# Patient Record
Sex: Female | Born: 1945
Health system: Southern US, Community
[De-identification: ages and names within clinical notes are randomized; demographics above are authoritative.]

## PROBLEM LIST (undated history)

## (undated) DIAGNOSIS — M1990 Unspecified osteoarthritis, unspecified site: Secondary | ICD-10-CM

## (undated) DIAGNOSIS — K5792 Diverticulitis of intestine, part unspecified, without perforation or abscess without bleeding: Secondary | ICD-10-CM

## (undated) DIAGNOSIS — E039 Hypothyroidism, unspecified: Secondary | ICD-10-CM

## (undated) DIAGNOSIS — C801 Malignant (primary) neoplasm, unspecified: Secondary | ICD-10-CM

## (undated) DIAGNOSIS — D649 Anemia, unspecified: Secondary | ICD-10-CM

## (undated) DIAGNOSIS — I10 Essential (primary) hypertension: Secondary | ICD-10-CM

## (undated) HISTORY — PX: EYE SURGERY: SHX253

## (undated) HISTORY — PX: BREAST BIOPSY: SHX20

## (undated) HISTORY — PX: COLONOSCOPY: SHX174

## (undated) HISTORY — PX: APPENDECTOMY: SHX54

---

## 2001-12-14 ENCOUNTER — Ambulatory Visit (HOSPITAL_BASED_OUTPATIENT_CLINIC_OR_DEPARTMENT_OTHER): Admission: RE | Admit: 2001-12-14 | Discharge: 2001-12-14 | Payer: Self-pay | Admitting: Orthopedic Surgery

## 2004-06-12 ENCOUNTER — Ambulatory Visit: Payer: Self-pay | Admitting: Internal Medicine

## 2005-08-23 ENCOUNTER — Ambulatory Visit: Payer: Self-pay

## 2006-02-11 ENCOUNTER — Emergency Department: Payer: Self-pay | Admitting: Emergency Medicine

## 2006-02-17 ENCOUNTER — Ambulatory Visit: Payer: Self-pay | Admitting: Pain Medicine

## 2006-02-28 ENCOUNTER — Ambulatory Visit: Payer: Self-pay | Admitting: Pain Medicine

## 2006-03-14 ENCOUNTER — Ambulatory Visit: Payer: Self-pay | Admitting: Pain Medicine

## 2006-04-03 ENCOUNTER — Ambulatory Visit: Payer: Self-pay | Admitting: Physician Assistant

## 2006-09-21 ENCOUNTER — Ambulatory Visit: Payer: Self-pay

## 2007-11-01 ENCOUNTER — Ambulatory Visit: Payer: Self-pay

## 2008-11-10 ENCOUNTER — Ambulatory Visit: Payer: Self-pay

## 2008-11-26 ENCOUNTER — Ambulatory Visit: Payer: Self-pay

## 2009-12-07 ENCOUNTER — Ambulatory Visit: Payer: Self-pay | Admitting: Unknown Physician Specialty

## 2010-09-13 DIAGNOSIS — G4700 Insomnia, unspecified: Secondary | ICD-10-CM | POA: Insufficient documentation

## 2010-11-23 ENCOUNTER — Ambulatory Visit: Payer: Self-pay | Admitting: Family Medicine

## 2010-12-09 ENCOUNTER — Ambulatory Visit: Payer: Self-pay | Admitting: Family Medicine

## 2010-12-22 ENCOUNTER — Other Ambulatory Visit: Payer: Self-pay | Admitting: Hematology and Oncology

## 2011-01-13 ENCOUNTER — Other Ambulatory Visit (HOSPITAL_COMMUNITY): Payer: Self-pay | Admitting: Hematology and Oncology

## 2011-11-24 ENCOUNTER — Emergency Department: Payer: Self-pay | Admitting: Emergency Medicine

## 2011-11-24 LAB — CBC
HCT: 35.1 % (ref 35.0–47.0)
HGB: 12 g/dL (ref 12.0–16.0)
MCH: 32 pg (ref 26.0–34.0)
MCHC: 34.3 g/dL (ref 32.0–36.0)
Platelet: 237 10*3/uL (ref 150–440)
WBC: 7.9 10*3/uL (ref 3.6–11.0)

## 2011-11-24 LAB — URINALYSIS, COMPLETE
Bacteria: NONE SEEN
Bilirubin,UR: NEGATIVE
Glucose,UR: NEGATIVE mg/dL (ref 0–75)
Leukocyte Esterase: NEGATIVE
Nitrite: NEGATIVE
Protein: NEGATIVE
RBC,UR: NONE SEEN /HPF (ref 0–5)
Specific Gravity: 1.003 (ref 1.003–1.030)
WBC UR: 1 /HPF (ref 0–5)

## 2011-11-24 LAB — COMPREHENSIVE METABOLIC PANEL
Albumin: 3.6 g/dL (ref 3.4–5.0)
BUN: 11 mg/dL (ref 7–18)
Bilirubin,Total: 0.3 mg/dL (ref 0.2–1.0)
Chloride: 106 mmol/L (ref 98–107)
Creatinine: 0.57 mg/dL — ABNORMAL LOW (ref 0.60–1.30)
EGFR (African American): >60
Glucose: 104 mg/dL — ABNORMAL HIGH (ref 65–99)
Osmolality: 279 (ref 275–301)
SGPT (ALT): 133 U/L — ABNORMAL HIGH (ref 12–78)
Total Protein: 7.5 g/dL (ref 6.4–8.2)

## 2011-12-01 ENCOUNTER — Ambulatory Visit: Payer: Self-pay | Admitting: Family Medicine

## 2011-12-13 ENCOUNTER — Ambulatory Visit: Payer: Self-pay | Admitting: Family Medicine

## 2012-12-14 ENCOUNTER — Ambulatory Visit: Payer: Self-pay | Admitting: Family Medicine

## 2013-05-31 DIAGNOSIS — R9431 Abnormal electrocardiogram [ECG] [EKG]: Secondary | ICD-10-CM | POA: Insufficient documentation

## 2013-11-26 DIAGNOSIS — E7800 Pure hypercholesterolemia, unspecified: Secondary | ICD-10-CM | POA: Insufficient documentation

## 2013-12-02 DIAGNOSIS — M25551 Pain in right hip: Secondary | ICD-10-CM | POA: Insufficient documentation

## 2013-12-02 DIAGNOSIS — M47816 Spondylosis without myelopathy or radiculopathy, lumbar region: Secondary | ICD-10-CM | POA: Insufficient documentation

## 2013-12-03 ENCOUNTER — Ambulatory Visit: Payer: Self-pay | Admitting: Family Medicine

## 2013-12-10 DIAGNOSIS — M5416 Radiculopathy, lumbar region: Secondary | ICD-10-CM | POA: Insufficient documentation

## 2013-12-10 DIAGNOSIS — M47818 Spondylosis without myelopathy or radiculopathy, sacral and sacrococcygeal region: Secondary | ICD-10-CM | POA: Insufficient documentation

## 2013-12-10 DIAGNOSIS — M461 Sacroiliitis, not elsewhere classified: Secondary | ICD-10-CM | POA: Insufficient documentation

## 2013-12-10 DIAGNOSIS — M48061 Spinal stenosis, lumbar region without neurogenic claudication: Secondary | ICD-10-CM | POA: Insufficient documentation

## 2013-12-31 DIAGNOSIS — M47816 Spondylosis without myelopathy or radiculopathy, lumbar region: Secondary | ICD-10-CM | POA: Insufficient documentation

## 2014-11-26 DIAGNOSIS — K5732 Diverticulitis of large intestine without perforation or abscess without bleeding: Secondary | ICD-10-CM | POA: Insufficient documentation

## 2014-12-10 ENCOUNTER — Other Ambulatory Visit: Payer: Self-pay | Admitting: Family Medicine

## 2014-12-10 DIAGNOSIS — Z1231 Encounter for screening mammogram for malignant neoplasm of breast: Secondary | ICD-10-CM

## 2014-12-11 ENCOUNTER — Ambulatory Visit
Admission: RE | Admit: 2014-12-11 | Discharge: 2014-12-11 | Disposition: A | Discharge status: Home / Self Care | Payer: Medicare PPO | Source: Ambulatory Visit | Attending: Family Medicine | Admitting: Family Medicine

## 2014-12-11 DIAGNOSIS — Z1231 Encounter for screening mammogram for malignant neoplasm of breast: Secondary | ICD-10-CM | POA: Diagnosis not present

## 2014-12-11 HISTORY — DX: Malignant (primary) neoplasm, unspecified: C80.1

## 2015-05-07 DIAGNOSIS — F418 Other specified anxiety disorders: Secondary | ICD-10-CM | POA: Insufficient documentation

## 2015-07-12 ENCOUNTER — Encounter: Payer: Self-pay | Admitting: Emergency Medicine

## 2015-07-12 ENCOUNTER — Ambulatory Visit
Admission: EM | Admit: 2015-07-12 | Discharge: 2015-07-12 | Disposition: A | Discharge status: Home / Self Care | Payer: Medicare Other | Attending: Family Medicine | Admitting: Family Medicine

## 2015-07-12 DIAGNOSIS — L259 Unspecified contact dermatitis, unspecified cause: Secondary | ICD-10-CM

## 2015-07-12 MED ORDER — PREDNISONE 20 MG PO TABS: ORAL_TABLET | ORAL | Status: DC

## 2015-07-12 MED ORDER — METHYLPREDNISOLONE SODIUM SUCC 125 MG IJ SOLR
62.5000 mg | Freq: Once | INTRAMUSCULAR | Status: AC
  Administered 2015-07-12: 62.5 mg via INTRAMUSCULAR

## 2015-07-12 NOTE — ED Notes (Signed)
Patient c/o itchy rash on both arms since last Sunday.  Patient states that on last Saturday she was working out in the yard.

## 2015-07-12 NOTE — ED Provider Notes (Signed)
CSN: [id number]     Arrival date & time 07/12/15  1426 History   First MD Initiated Contact with Patient 07/12/15 1435     Chief Complaint  Patient presents with  . Rash   (Consider location/radiation/quality/duration/timing/severity/associated sxs/prior Treatment) HPI: Patient presents today with symptoms of itchy rash on both arms. She states that her symptoms started last week. She did go to her primary care physician who gave her cream for triamcinolone. This is helped minimally. She has been taking Zyrtec daily. She does admit to being in her yard and does feel that perhaps she did get exposed to something in the yard. She has not been in the yard since that time. She denies any chest pain or shortness of breath. She denies any pain of the area or any colored discharge from the rash.  Past Medical History  Diagnosis Date  . Cancer ([place])     basal cell and melanoma   Past Surgical History  Procedure Laterality Date  . Breast biopsy Left 1980's &06/18/97    neg  . Appendectomy     Family History  Problem Relation Age of Onset  . Breast cancer Cousin    Social History  Substance Use Topics  . Smoking status: Never Smoker   . Smokeless tobacco: None  . Alcohol Use: Yes   OB History    No data available     Review of Systems: Negative except mentioned above.   Allergies  Erythromycin and Doxycycline  Home Medications   Prior to Admission medications   Medication Sig Start Date End Date Taking? Authorizing Provider  cetirizine (ZYRTEC) 10 MG tablet Take 10 mg by mouth daily.   Yes Historical Provider, MD  fluticasone (FLONASE) 50 MCG/ACT nasal spray Place 1 spray into both nostrils daily.   Yes Historical Provider, MD  levothyroxine (SYNTHROID, LEVOTHROID) 100 MCG tablet Take 100 mcg by mouth daily before breakfast.   Yes Historical Provider, MD   Meds Ordered and Administered this Visit  Medications - No data to display  BP 158/68 mmHg  Pulse 81  Temp(Src) 98.3 F  (36.8 C) (Tympanic)  Resp 16  Ht 5\' 2"  (1.575 m)  Wt 115 lb (52.164 kg)  BMI 21.03 kg/m2  SpO2 96% No data found.   Physical Exam   GENERAL: NAD HEENT: no pharyngeal erythema, no exudate, no cervical LAD RESP: CTA B CARD: RRR SKIN: Erythematous vesicular rash noted on the right upper arm and on the right forearm and left forearm, no signs of secondary infection noted NEURO: CN II-XII grossly intact   ED Course  Procedures (including critical care time)  Labs Review Labs Reviewed - No data to display  Imaging Review No results found.      MDM  A/P: Skin rash, contact dermatitis- we'll treat patient with Solumedrol IM in the office today, patient is continue her oral antihistamine daily, keep the area clean and dry, monitor for any signs of infection, will start oral prednisone tomorrow for a few days, if any further problems patient is to follow-up with her primary care physician as instructed. I've asked that she stay out of her yard until this area has cleared up.    Paulina Fusi, MD 07/12/15 [phone]

## 2015-08-26 DIAGNOSIS — S83231A Complex tear of medial meniscus, current injury, right knee, initial encounter: Secondary | ICD-10-CM | POA: Insufficient documentation

## 2015-11-09 ENCOUNTER — Other Ambulatory Visit: Payer: Self-pay | Admitting: Family Medicine

## 2015-11-09 DIAGNOSIS — Z1231 Encounter for screening mammogram for malignant neoplasm of breast: Secondary | ICD-10-CM

## 2015-12-14 ENCOUNTER — Ambulatory Visit
Admission: RE | Admit: 2015-12-14 | Discharge: 2015-12-14 | Disposition: A | Discharge status: Home / Self Care | Payer: Medicare Other | Source: Ambulatory Visit | Attending: Family Medicine | Admitting: Family Medicine

## 2015-12-14 DIAGNOSIS — Z1231 Encounter for screening mammogram for malignant neoplasm of breast: Secondary | ICD-10-CM | POA: Insufficient documentation

## 2016-01-27 ENCOUNTER — Other Ambulatory Visit: Payer: Self-pay | Admitting: Family Medicine

## 2016-01-27 DIAGNOSIS — Z780 Asymptomatic menopausal state: Secondary | ICD-10-CM

## 2016-03-07 ENCOUNTER — Ambulatory Visit
Admission: RE | Admit: 2016-03-07 | Discharge: 2016-03-07 | Disposition: A | Discharge status: Home / Self Care | Payer: Medicare Other | Source: Ambulatory Visit | Attending: Family Medicine | Admitting: Family Medicine

## 2016-03-07 DIAGNOSIS — Z780 Asymptomatic menopausal state: Secondary | ICD-10-CM | POA: Insufficient documentation

## 2016-03-07 DIAGNOSIS — M8580 Other specified disorders of bone density and structure, unspecified site: Secondary | ICD-10-CM | POA: Diagnosis not present

## 2016-09-14 ENCOUNTER — Other Ambulatory Visit: Payer: Self-pay | Admitting: Family Medicine

## 2016-09-15 ENCOUNTER — Other Ambulatory Visit: Payer: Self-pay | Admitting: Family Medicine

## 2016-09-15 DIAGNOSIS — N6459 Other signs and symptoms in breast: Secondary | ICD-10-CM

## 2016-10-25 ENCOUNTER — Other Ambulatory Visit: Payer: Medicare Other

## 2016-10-27 ENCOUNTER — Ambulatory Visit
Admission: RE | Admit: 2016-10-27 | Discharge: 2016-10-27 | Disposition: A | Discharge status: Home / Self Care | Payer: Medicare Other | Source: Ambulatory Visit | Attending: Family Medicine | Admitting: Family Medicine

## 2016-10-27 DIAGNOSIS — N649 Disorder of breast, unspecified: Secondary | ICD-10-CM | POA: Diagnosis present

## 2016-10-27 DIAGNOSIS — N6459 Other signs and symptoms in breast: Secondary | ICD-10-CM

## 2016-10-27 DIAGNOSIS — N6452 Nipple discharge: Secondary | ICD-10-CM | POA: Diagnosis not present

## 2017-02-06 ENCOUNTER — Other Ambulatory Visit: Payer: Self-pay | Admitting: Family Medicine

## 2017-02-06 ENCOUNTER — Ambulatory Visit
Admission: RE | Admit: 2017-02-06 | Discharge: 2017-02-06 | Disposition: A | Discharge status: Home / Self Care | Payer: Medicare Other | Source: Ambulatory Visit | Attending: Family Medicine | Admitting: Family Medicine

## 2017-02-06 DIAGNOSIS — R05 Cough: Secondary | ICD-10-CM

## 2017-02-06 DIAGNOSIS — R059 Cough, unspecified: Secondary | ICD-10-CM

## 2017-02-06 DIAGNOSIS — R918 Other nonspecific abnormal finding of lung field: Secondary | ICD-10-CM | POA: Diagnosis not present

## 2017-02-06 DIAGNOSIS — R509 Fever, unspecified: Secondary | ICD-10-CM

## 2017-04-28 ENCOUNTER — Ambulatory Visit
Admission: RE | Admit: 2017-04-28 | Discharge: 2017-04-28 | Disposition: A | Discharge status: Home / Self Care | Payer: Medicare Other | Source: Ambulatory Visit | Attending: Family Medicine | Admitting: Family Medicine

## 2017-04-28 ENCOUNTER — Other Ambulatory Visit: Payer: Self-pay | Admitting: Family Medicine

## 2017-04-28 DIAGNOSIS — J984 Other disorders of lung: Secondary | ICD-10-CM | POA: Insufficient documentation

## 2017-04-28 DIAGNOSIS — J449 Chronic obstructive pulmonary disease, unspecified: Secondary | ICD-10-CM | POA: Insufficient documentation

## 2017-04-28 DIAGNOSIS — R9389 Abnormal findings on diagnostic imaging of other specified body structures: Secondary | ICD-10-CM

## 2017-07-06 ENCOUNTER — Other Ambulatory Visit: Payer: Self-pay | Admitting: Orthopedic Surgery

## 2017-07-06 DIAGNOSIS — M2391 Unspecified internal derangement of right knee: Secondary | ICD-10-CM

## 2017-07-06 DIAGNOSIS — M2351 Chronic instability of knee, right knee: Secondary | ICD-10-CM

## 2017-07-06 DIAGNOSIS — M25561 Pain in right knee: Principal | ICD-10-CM

## 2017-07-06 DIAGNOSIS — G8929 Other chronic pain: Secondary | ICD-10-CM

## 2017-07-10 DIAGNOSIS — I10 Essential (primary) hypertension: Secondary | ICD-10-CM | POA: Insufficient documentation

## 2017-07-25 ENCOUNTER — Ambulatory Visit
Admission: RE | Admit: 2017-07-25 | Discharge: 2017-07-25 | Disposition: A | Discharge status: Home / Self Care | Payer: Medicare Other | Source: Ambulatory Visit | Attending: Orthopedic Surgery | Admitting: Orthopedic Surgery

## 2017-07-25 DIAGNOSIS — M7121 Synovial cyst of popliteal space [Baker], right knee: Secondary | ICD-10-CM | POA: Insufficient documentation

## 2017-07-25 DIAGNOSIS — M2391 Unspecified internal derangement of right knee: Secondary | ICD-10-CM

## 2017-07-25 DIAGNOSIS — M2351 Chronic instability of knee, right knee: Secondary | ICD-10-CM

## 2017-07-25 DIAGNOSIS — M1711 Unilateral primary osteoarthritis, right knee: Secondary | ICD-10-CM | POA: Insufficient documentation

## 2017-07-25 DIAGNOSIS — G8929 Other chronic pain: Secondary | ICD-10-CM | POA: Insufficient documentation

## 2017-07-25 DIAGNOSIS — M25561 Pain in right knee: Secondary | ICD-10-CM

## 2017-08-10 DIAGNOSIS — M2391 Unspecified internal derangement of right knee: Secondary | ICD-10-CM | POA: Insufficient documentation

## 2017-08-10 DIAGNOSIS — M1711 Unilateral primary osteoarthritis, right knee: Secondary | ICD-10-CM | POA: Insufficient documentation

## 2018-01-26 ENCOUNTER — Other Ambulatory Visit: Payer: Self-pay | Admitting: Family Medicine

## 2018-01-26 DIAGNOSIS — Z1231 Encounter for screening mammogram for malignant neoplasm of breast: Secondary | ICD-10-CM

## 2018-01-29 ENCOUNTER — Ambulatory Visit
Admission: RE | Admit: 2018-01-29 | Discharge: 2018-01-29 | Disposition: A | Discharge status: Home / Self Care | Payer: Medicare Other | Source: Ambulatory Visit | Attending: Family Medicine | Admitting: Family Medicine

## 2018-01-29 DIAGNOSIS — Z1231 Encounter for screening mammogram for malignant neoplasm of breast: Secondary | ICD-10-CM | POA: Diagnosis present

## 2018-02-09 ENCOUNTER — Ambulatory Visit
Admission: EM | Admit: 2018-02-09 | Discharge: 2018-02-09 | Disposition: A | Discharge status: Home / Self Care | Payer: Medicare Other | Attending: Family Medicine | Admitting: Family Medicine

## 2018-02-09 ENCOUNTER — Encounter: Payer: Self-pay | Admitting: Emergency Medicine

## 2018-02-09 ENCOUNTER — Other Ambulatory Visit: Payer: Self-pay

## 2018-02-09 DIAGNOSIS — R05 Cough: Secondary | ICD-10-CM

## 2018-02-09 DIAGNOSIS — J069 Acute upper respiratory infection, unspecified: Secondary | ICD-10-CM | POA: Insufficient documentation

## 2018-02-09 DIAGNOSIS — J0100 Acute maxillary sinusitis, unspecified: Secondary | ICD-10-CM | POA: Diagnosis not present

## 2018-02-09 DIAGNOSIS — J029 Acute pharyngitis, unspecified: Secondary | ICD-10-CM | POA: Diagnosis not present

## 2018-02-09 DIAGNOSIS — R0981 Nasal congestion: Secondary | ICD-10-CM | POA: Diagnosis not present

## 2018-02-09 LAB — RAPID STREP SCREEN (MED CTR MEBANE ONLY): STREPTOCOCCUS, GROUP A SCREEN (DIRECT): NEGATIVE

## 2018-02-09 MED ORDER — AMOXICILLIN-POT CLAVULANATE 875-125 MG PO TABS: 1.0000 | ORAL_TABLET | Freq: Two times a day (BID) | ORAL | 0 refills | Status: DC

## 2018-02-09 NOTE — ED Provider Notes (Signed)
[facility] ____________________________________________  Time seen: Approximately 2:06 PM  I have reviewed the triage vital signs and the nursing notes.   HISTORY  Chief Complaint No chief complaint on file.  HPI Sara Murphy is a 73 y.o. female [facility] evaluation of 3 weeks of runny nose, nasal congestion with intermittent cough.  States cough is mostly at night.  States in the last week she has had increased sinus pressure, nasal drainage and some sore throat.  States her throat currently milder.  Patient expressed concern as her daughter recently was diagnosed with sore throat positive for strep.  Has continued to eat and drink well.  Has tried her normal allergy regimen of Zyrtec and Singulair without resolution.  Has continued remain active.  States pressure in her forehead as well as her cheeks and both ears feel clogged with some pressure, occasional ringing.  Postnasal drainage.  Denies chest pain or shortness of breath.  Denies other aggravating alleviating factors.  Reports otherwise doing well denies recent sickness.  Hortencia Pilar, MD: PCP   Past Medical History:  Diagnosis Date  . Cancer ([place])    basal cell and melanoma    There are no active problems to display for this patient.   Past Surgical History:  Procedure Laterality Date  . APPENDECTOMY    . BREAST BIOPSY Left 1980's &06/18/97   neg     No current facility-administered medications for this encounter.   Current Outpatient Medications:  .  cetirizine (ZYRTEC) 10 MG tablet, Take 10 mg by mouth daily., Disp: , Rfl:  .  fluticasone (FLONASE) 50 MCG/ACT nasal spray, Place 1 spray into both nostrils daily., Disp: , Rfl:  .  levothyroxine (SYNTHROID, LEVOTHROID) 100 MCG tablet, Take 100 mcg by mouth daily before breakfast., Disp: , Rfl:  .  lisinopril (PRINIVIL,ZESTRIL) 2.5 MG tablet, Take 5 mg by mouth., Disp: , Rfl:  .  montelukast (SINGULAIR) 10 MG tablet, , Disp: , Rfl:  .   amoxicillin-clavulanate (AUGMENTIN) 875-125 MG tablet, Take 1 tablet by mouth every 12 (twelve) hours., Disp: 20 tablet, Rfl: 0  Allergies Erythromycin and Doxycycline  Family History  Problem Relation Age of Onset  . Breast cancer Cousin 30    Social History Social History   Tobacco Use  . Smoking status: Never Smoker  . Smokeless tobacco: Never Used  Substance Use Topics  . Alcohol use: Yes  . Drug use: Never    Review of Systems Constitutional: No fever ENT: Positive sore throat.  As above. Cardiovascular: Denies chest pain. Respiratory: Denies shortness of breath. Gastrointestinal: No abdominal pain.  Musculoskeletal: Negative for back pain. Skin: Negative for rash.   ____________________________________________   PHYSICAL EXAM:  VITAL SIGNS: ED Triage Vitals  Enc Vitals Group     BP 02/09/18 1118 (!) 165/81     Pulse Rate 02/09/18 1118 64     Resp 02/09/18 1118 18     Temp 02/09/18 1118 97.8 F (36.6 C)     Temp Source 02/09/18 1118 Oral     SpO2 02/09/18 1118 100 %     Weight 02/09/18 1114 114 lb (51.7 kg)     Height 02/09/18 1114 5\' 2"  (1.575 m)     Head Circumference --      Peak Flow --      Pain Score 02/09/18 1114 2     Pain Loc --      Pain Edu? --      Excl. in [place]? --  Constitutional: Alert and oriented. Well appearing and in no acute distress. Eyes: Conjunctivae are normal.  Head: Atraumatic.Mild to moderate tenderness to palpation bilateral maxillary sinuses.  No frontal sinus tenderness palpation.  No swelling. No erythema.   Ears: no erythema, normal TMs bilaterally.   Nose: nasal congestion with bilateral nasal turbinate erythema and edema.   Mouth/Throat: Mucous membranes are moist.  Oropharynx non-erythematous.No tonsillar swelling or exudate.  Neck: No stridor.  No cervical spine tenderness to palpation. Hematological/Lymphatic/Immunilogical: No cervical lymphadenopathy. Cardiovascular: Normal rate, regular rhythm. Grossly normal  heart sounds.  Good peripheral circulation. Respiratory: Normal respiratory effort.  No retractions. No wheezes, rales or rhonchi. Good air movement.  Musculoskeletal: Steady gait. Neurologic:  Normal speech and language. No gait instability. Skin:  Skin is warm, dry and intact. No rash noted. Psychiatric: Mood and affect are normal. Speech and behavior are normal.  ___________________________________________   LABS (all labs ordered are listed, but only abnormal results are displayed)  Labs Reviewed  RAPID STREP SCREEN ([facility] ONLY)  CULTURE, GROUP A STREP [facility])    PROCEDURES Procedures   INITIAL IMPRESSION / ASSESSMENT AND PLAN / ED COURSE  Pertinent labs & imaging results that were available during my care of the patient were reviewed by me and considered in my medical decision making (see chart for details).  Well-appearing patient.  No acute distress.  Strep negative, will culture.  Suspect recent viral upper respiratory infection with secondary sinusitis.  Will treat with oral Augmentin.  Encourage rest, fluids, supportive care.Discussed indication, risks and benefits of medications with patient.  Discussed follow up with Primary care physician this week. Discussed follow up and return parameters including no resolution or any worsening concerns. Patient verbalized understanding and agreed to plan.   ____________________________________________   FINAL CLINICAL IMPRESSION(S) / ED DIAGNOSES  Final diagnoses:  Acute maxillary sinusitis, recurrence not specified  Acute upper respiratory infection     ED Discharge Orders         Ordered    amoxicillin-clavulanate (AUGMENTIN) 875-125 MG tablet  Every 12 hours     02/09/18 1159           Note: This dictation was prepared with Dragon dictation along with smaller phrase technology. Any transcriptional errors that result from this process are unintentional.         Marylene Land, NP 02/09/18 1415

## 2018-02-09 NOTE — ED Triage Notes (Signed)
Pt c/o sore throat for about 3 weeks, headache, congestion, chills,  post nasal drainage, bilateral ear pain (right worse than left). Headache and other symptoms started about 4 days ago. Daughter recently been diagnosed with strep throat.

## 2018-02-09 NOTE — Discharge Instructions (Addendum)
Take medication as prescribed. Rest. Drink plenty of fluids.  ° °Follow up with your primary care physician this week as needed. Return to Urgent care for new or worsening concerns.  ° °

## 2018-02-12 LAB — CULTURE, GROUP A STREP (THRC)

## 2018-02-21 ENCOUNTER — Other Ambulatory Visit: Payer: Self-pay | Admitting: Family Medicine

## 2018-02-21 DIAGNOSIS — R0989 Other specified symptoms and signs involving the circulatory and respiratory systems: Secondary | ICD-10-CM

## 2018-02-26 ENCOUNTER — Ambulatory Visit
Admission: RE | Admit: 2018-02-26 | Discharge: 2018-02-26 | Disposition: A | Discharge status: Home / Self Care | Payer: Medicare Other | Source: Ambulatory Visit | Attending: Family Medicine | Admitting: Family Medicine

## 2018-02-26 DIAGNOSIS — R0989 Other specified symptoms and signs involving the circulatory and respiratory systems: Secondary | ICD-10-CM | POA: Insufficient documentation

## 2018-05-20 ENCOUNTER — Ambulatory Visit
Admission: EM | Admit: 2018-05-20 | Discharge: 2018-05-20 | Disposition: A | Discharge status: Home / Self Care | Payer: Medicare Other | Attending: Family Medicine | Admitting: Family Medicine

## 2018-05-20 ENCOUNTER — Other Ambulatory Visit: Payer: Self-pay

## 2018-05-20 ENCOUNTER — Encounter: Payer: Self-pay | Admitting: Emergency Medicine

## 2018-05-20 DIAGNOSIS — S0501XA Injury of conjunctiva and corneal abrasion without foreign body, right eye, initial encounter: Secondary | ICD-10-CM | POA: Diagnosis not present

## 2018-05-20 DIAGNOSIS — Y93H2 Activity, gardening and landscaping: Secondary | ICD-10-CM | POA: Diagnosis not present

## 2018-05-20 MED ORDER — TETRACAINE HCL 0.5 % OP SOLN: 2.0000 [drp] | Freq: Once | OPHTHALMIC | Status: DC

## 2018-05-20 MED ORDER — ERYTHROMYCIN 5 MG/GM OP OINT: TOPICAL_OINTMENT | OPHTHALMIC | 0 refills | Status: DC

## 2018-05-20 MED ORDER — FLUORESCEIN SODIUM 1 MG OP STRP: 1.0000 | ORAL_STRIP | Freq: Once | OPHTHALMIC | Status: DC

## 2018-05-20 NOTE — ED Provider Notes (Signed)
[address] [place], [place] [address] [phone]   Name: Sara Murphy DOB: 10/08/1945 MRN: [id number] CSN: [id number]  Nettie date and time:  05/20/18 1109  Chief Complaint:  Eye Problem (right)  NOTE: Prior to seeing the patient today, I have reviewed the triage nursing documentation and vital signs. Clinical staff has updated patient's PMH/PSHx, current medication list, and drug allergies/intolerances to ensure comprehensive history available to assist in medical decision making.   History:   HPI: Sara Murphy is a 73 y.o. female who presents today with complaints of RIGHT eye pain and tearing since 05/18/2018. Patient notes that symptoms started after she was mowing on Friday. She complaints of progressive pain that intermittently radiates into RIGHT ear and excessive tearing. Patient endorses that she rubbed her eye a lot while mowing, and that she has continued to rub her eye due to pain, itching, and tearing. She notes that there has been some yellow colored exudate. Vision is blurred. Patient normally wears corrective contact lenses, however due to pain and associated symptoms, she has been unable to wear her contact in the RIGHT eye.  Visual acuity, as assessed by clinic staff, as follows: 20/70 (R), 20/50 (L), and 20/50 (B).  Past Medical History:  Diagnosis Date  . Cancer ([place])    basal cell and melanoma    Past Surgical History:  Procedure Laterality Date  . APPENDECTOMY    . BREAST BIOPSY Left 1980's &06/18/97   neg    Family History  Problem Relation Age of Onset  . Breast cancer Cousin 30    Social History   Socioeconomic History  . Marital status: Married    Spouse name: Not on file  . Number of children: Not on file  . Years of education: Not on file  . Highest education level: Not on file  Occupational History  . Not on file  Social Needs  . Financial resource strain: Not on file  . Food insecurity:    Worry:  Not on file    Inability: Not on file  . Transportation needs:    Medical: Not on file    Non-medical: Not on file  Tobacco Use  . Smoking status: Never Smoker  . Smokeless tobacco: Never Used  Substance and Sexual Activity  . Alcohol use: Yes  . Drug use: Never  . Sexual activity: Not on file  Lifestyle  . Physical activity:    Days per week: Not on file    Minutes per session: Not on file  . Stress: Not on file  Relationships  . Social connections:    Talks on phone: Not on file    Gets together: Not on file    Attends religious service: Not on file    Active member of club or organization: Not on file    Attends meetings of clubs or organizations: Not on file    Relationship status: Not on file  . Intimate partner violence:    Fear of current or ex partner: Not on file    Emotionally abused: Not on file    Physically abused: Not on file    Forced sexual activity: Not on file  Other Topics Concern  . Not on file  Social History Narrative  . Not on file    There are no active problems to display for this patient.   Home Medications:    Current Meds  Medication Sig  . cetirizine (ZYRTEC) 10 MG tablet Take 10 mg by mouth  daily.  . fluticasone (FLONASE) 50 MCG/ACT nasal spray Place 1 spray into both nostrils daily.  Marland Kitchen levothyroxine (SYNTHROID, LEVOTHROID) 100 MCG tablet Take 100 mcg by mouth daily before breakfast.  . lisinopril (PRINIVIL,ZESTRIL) 2.5 MG tablet Take 5 mg by mouth.  . montelukast (SINGULAIR) 10 MG tablet     Allergies:   Erythromycin and Doxycycline  Review of Systems (ROS): Review of Systems  Constitutional: Negative for chills and fever.  HENT: Positive for ear pain (RIGHT). Negative for hearing loss, sinus pain and tinnitus.   Eyes: Positive for photophobia, pain, discharge, redness, itching and visual disturbance (blurred).  Respiratory: Negative for cough and shortness of breath.   Cardiovascular: Negative for chest pain and palpitations.   Skin: Negative for color change and rash.  Allergic/Immunologic: Positive for environmental allergies (seasonal).  All other systems reviewed and are negative.    Physical Exam:  Triage Vital Signs ED Triage Vitals [05/20/18 1150]  Enc Vitals Group     BP      Pulse      Resp      Temp      Temp src      SpO2      Weight 114 lb (51.7 kg)     Height 5\' 2"  (1.575 m)     Head Circumference      Peak Flow      Pain Score 2     Pain Loc      Pain Edu?      Excl. in [place]?     Physical Exam  Constitutional: She is oriented to person, place, and time and well-developed, well-nourished, and in no distress.  HENT:  Head: Normocephalic and atraumatic.  Right Ear: Hearing, tympanic membrane, external ear and ear canal normal. No drainage.  Left Ear: Hearing, tympanic membrane, external ear and ear canal normal. No drainage.  Mouth/Throat: Oropharynx is clear and moist and mucous membranes are normal.  Eyes: Pupils are equal, round, and reactive to light. EOM are normal. Right eye exhibits discharge. No foreign body present in the right eye. Left eye exhibits no discharge. Right conjunctiva is injected. Left conjunctiva is not injected. Right eye exhibits normal extraocular motion and no nystagmus. Left eye exhibits normal extraocular motion.  Cardiovascular: Normal rate, regular rhythm, normal heart sounds and intact distal pulses. Exam reveals no gallop and no friction rub.  No murmur heard. Pulmonary/Chest: Effort normal and breath sounds normal. No respiratory distress. She has no wheezes. She has no rales.  Neurological: She is alert and oriented to person, place, and time.  Skin: Skin is warm and dry. No rash noted. No erythema.  Psychiatric: Mood, affect and judgment normal.  Nursing note and vitals reviewed.    Urgent Care Treatments / Results:   LABS: PLEASE NOTE: all labs that were ordered this encounter are listed, however only abnormal results are displayed. Labs Reviewed  - No data to display  EKG: No orders found for this or any previous visit.  RADIOLOGY: No results found.  PRODEDURES:  Tetracaine applied. Fluorescein stain applied. Eye visualized under black light, which revealed increased uptake in the upper outer aspect of the RIGHT cornea.   MEDICATIONS RECEIVED THIS VISIT: Medications  tetracaine (PONTOCAINE) 0.5 % ophthalmic solution 2 drop (has no administration in time range)  fluorescein ophthalmic strip 1 strip (has no administration in time range)     Initial Impression / Assessment and Plan / Urgent Care Course:   Pertinent labs & imaging results that  were available during my care of the patient were personally reviewed by me and considered in my medical decision making (see chart for details).   Sara Murphy is a 73 y.o. female who presents to [facility] today with complaints of RIGHT eye pain since 05/18/2018. Patient notes that symptoms started after mowing. She endorses excessive rubbing both then and now. Eye with tearing and some slight yellow exudative discharge. Patient with (+) photophobia. Pain intermittent radiates into RIGHT ear.   Topical analgesia provided prior to exam under black light. RIGHT eye revealed increased fluorescein uptake in the upper outer aspect of the RIGHT cornea consistent with abrasion. Discussed treatment. Patient intolerant to oral ERY, hwoever notes that she has successfully used the ophthalmic prep in the past. Will cover with ERY ointment QID x 5 day. May use Tylenol and/or Ibuprofen as needed for pain/fever. Encouraged cool compresses to help with symptomatic relief.   Discussed follow up with opthalmology this week for re-evaluation. She notes that her doctor may be closed and not able to see her. I provided her with the name of Dr. Birder Robson who she will need to contact for an appointment if unable to see her doctor. Patient is to avoid putting her contact back in until  symptoms have fully resolved. I have reviewed the follow up and strict return precautions for any new or worsening symptoms. Patient is aware of symptoms that would be deemed urgent/emergent, and would thus require further evaluation either here or in the emergency department. At the time of discharge, she verbalized understanding and consent with the discharge plan as it was reviewed with her. All questions were fielded by provider and/or clinic staff prior to patient discharge.    Final Clinical Impressions(s) / Urgent Care Diagnoses:   Final diagnoses:  Abrasion of right cornea, initial encounter    New Prescriptions:   Meds ordered this encounter  Medications  . tetracaine (PONTOCAINE) 0.5 % ophthalmic solution 2 drop  . fluorescein ophthalmic strip 1 strip  . erythromycin ophthalmic ointment    Sig: Place a 1/2 inch ribbon of ointment into the lower eyelid. 4 times a day x 5 days.    Dispense:  1 g    Refill:  0    Controlled Substance Prescriptions:  [place] Controlled Substance Registry consulted? Not Applicable  NOTE: This note was prepared using Dragon dictation software along with smaller phrase technology. Despite my best ability to proofread, there is the potential that transcriptional errors may still occur from this process, and are completely unintentional.     Karen Kitchens, NP 05/20/18 1506

## 2018-05-20 NOTE — Discharge Instructions (Addendum)
It was very nice meeting you today in clinic. Thank you for entrusting me with your care.   As discussed, you have a scratch on your RIGHT eye.  Please utilize the medications that we discussed. Your prescriptions have been called in to your pharmacy.  May use Tylenol and/or Ibuprofen as needed for pain/fever.  Avoid wearing contact until symptoms have fully resolved and cleared by your eye doctor.   Make arrangements to follow up your eye doctor for re-evaluation. If you cant get in, I have given you the name of someone at [facility].  If your symptoms/condition worsens, please seek follow up care either here or in the ER. Please remember, our [place] providers are "right here with you" when you need [place].   Again, it was my pleasure to take care of you today. Thank you for choosing our clinic. I hope that you start to feel better quickly.   Honor Loh, MSN, APRN, FNP-C, CEN Advanced Practice Provider [facility]

## 2018-05-20 NOTE — ED Triage Notes (Signed)
Patient c/o right eye pain, redness, and drainage that started on Friday after shoe mowed.

## 2019-02-25 ENCOUNTER — Other Ambulatory Visit: Payer: Self-pay | Admitting: Family Medicine

## 2019-02-25 DIAGNOSIS — Z1231 Encounter for screening mammogram for malignant neoplasm of breast: Secondary | ICD-10-CM

## 2019-02-26 ENCOUNTER — Other Ambulatory Visit: Payer: Self-pay

## 2019-02-26 ENCOUNTER — Ambulatory Visit
Admission: RE | Admit: 2019-02-26 | Discharge: 2019-02-26 | Disposition: A | Discharge status: Home / Self Care | Payer: Medicare PPO | Source: Ambulatory Visit | Attending: Family Medicine | Admitting: Family Medicine

## 2019-02-26 DIAGNOSIS — Z1231 Encounter for screening mammogram for malignant neoplasm of breast: Secondary | ICD-10-CM

## 2019-09-23 ENCOUNTER — Other Ambulatory Visit: Payer: Self-pay

## 2019-09-24 ENCOUNTER — Other Ambulatory Visit: Payer: Self-pay

## 2019-09-24 ENCOUNTER — Ambulatory Visit (INDEPENDENT_AMBULATORY_CARE_PROVIDER_SITE_OTHER): Payer: Medicare PPO | Admitting: Gastroenterology

## 2019-09-24 ENCOUNTER — Encounter: Payer: Self-pay | Admitting: Gastroenterology

## 2019-09-24 VITALS — BP 180/73 | HR 74 | Temp 97.5°F | Wt 111.6 lb

## 2019-09-24 DIAGNOSIS — R197 Diarrhea, unspecified: Secondary | ICD-10-CM

## 2019-09-24 DIAGNOSIS — R140 Abdominal distension (gaseous): Secondary | ICD-10-CM

## 2019-09-24 NOTE — Progress Notes (Signed)
Sara Murphy [place]  [place], [place] [address]  Main: [phone]  Fax: [phone]   Gastroenterology Consultation  Referring Provider:     Hortencia Pilar, MD Primary Care Physician:  Hortencia Pilar, MD Reason for Consultation:     Diarrhea        HPI:    Chief Complaint  Patient presents with  . Diarrhea    Sara Murphy is a 74 y.o. y/o female referred for consultation & management  by Dr. Hortencia Pilar, MD.  Reports 1 year history of diarrhea, but states symptoms may have been going on for longer than that.  Reports 3-4 loose bowel.  Has already had 5 loose bowel movements morning.  No blood in stool.  This is affecting her quality of life as well.  Takes Imodium as needed which helps moderately.  Also is taking Pepto-Bismol.  Review of her previous records reveals that patient had a colonoscopy and office visit 2016 with [facility].  The notes report that patient have an episode of acute diverticulitis for which she was admitted from November 1 - November 29, 2014 and this occurred 1 day after a colonoscopy done for "escalating diarrhea with some abdominal pain".  Colonoscopy revealed sigmoid colon diverticulosis, otherwise normal including TI inspection.  Biopsies were reported to be normal.  Diverticulitis was managed conservatively with antibiotics, and IV fluids.  CT scan at that time also reported mild intrahepatic dilatation, and GI mentions this as well. Please see detailed note in care everywhere. She also saw general surgery, Dr. Ronita Hipps who repeated the initial CT scan that showed diverticulitis, and a repeat scan showed complete resolution of uncomplicated diverticulitis.  Also reported resolution of previously seen mild intrahepatic bile duct dilatation.  Past Medical History:  Diagnosis Date  . Cancer ([place])    basal cell and melanoma    Past Surgical History:  Procedure Laterality Date  . APPENDECTOMY    . BREAST BIOPSY Left  1980's &06/18/97   neg    Prior to Admission medications   Medication Sig Start Date End Date Taking? Authorizing Provider  carboxymethylcellulose (REFRESH PLUS) 0.5 % SOLN Place 1-2 drops into both eyes 1 day or 1 dose.   Yes [provider]  cetirizine (ZYRTEC) 10 MG tablet Take 10 mg by mouth daily.   Yes [provider]  escitalopram (LEXAPRO) 5 MG tablet Take 1 tablet by mouth daily. 01/24/19  Yes [provider]  fluticasone (FLONASE) 50 MCG/ACT nasal spray Place 1 spray into both nostrils daily.   Yes [provider]  ipratropium (ATROVENT) 0.03 % nasal spray Place 2 sprays into the nose in the morning and at bedtime. 07/21/19 07/20/20 Yes [provider]  levothyroxine (SYNTHROID, LEVOTHROID) 100 MCG tablet Take 100 mcg by mouth daily before breakfast.   Yes [provider]  lisinopril (PRINIVIL,ZESTRIL) 2.5 MG tablet Take 5 mg by mouth. 06/28/17 09/24/19 Yes [provider]  montelukast (SINGULAIR) 10 MG tablet  09/18/17  Yes [provider]  Probiotic Product (PROBIOTIC-10 PO) Take by mouth daily.   Yes [provider]  doxepin (SINEQUAN) 10 MG capsule Take 1 capsule by mouth at bedtime. Patient not taking: Reported on 09/24/2019 11/29/18 11/29/19  [provider]    Family History  Problem Relation Age of Onset  . Breast cancer Cousin 30     Social History   Tobacco Use  . Smoking status: Never Smoker  . Smokeless tobacco: Never Used  Vaping Use  . Vaping Use: Never used  Substance Use Topics  . Alcohol use: Yes  . Drug use: Never    Allergies as of 09/24/2019 - Review Complete 09/24/2019  Allergen Reaction Noted  . Azithromycin Other (See Comments) 09/13/2010  . Nsaids Other (See Comments) 08/10/2017  . Erythromycin Nausea And Vomiting 07/12/2015  . Other Diarrhea 11/23/2012  . Doxycycline Rash 07/12/2015  . Latex Rash 09/20/2019    Review of Systems:    All systems reviewed and  negative except where noted in HPI.   Physical Exam:  BP (!) 180/73   Pulse 74   Temp (!) 97.5 F (36.4 C) (Oral)   Wt 111 lb 9.6 oz (50.6 kg)   BMI 20.41 kg/m  No LMP recorded. Patient is postmenopausal. Psych:  Alert and cooperative. Normal mood and affect. General:   Alert,  Well-developed, well-nourished, pleasant and cooperative in NAD Head:  Normocephalic and atraumatic. Eyes:  Sclera clear, no icterus.   Conjunctiva pink. Ears:  Normal auditory acuity. Nose:  No deformity, discharge, or lesions. Mouth:  No deformity or lesions,oropharynx pink & moist. Neck:  Supple; no masses or thyromegaly. Abdomen:  Normal bowel sounds.  No bruits.  Soft, non-tender and non-distended without masses, hepatosplenomegaly or hernias noted.  No guarding or rebound tenderness.    Msk:  Symmetrical without gross deformities. Good, equal movement & strength bilaterally. Pulses:  Normal pulses noted. Extremities:  No clubbing or edema.  No cyanosis. Neurologic:  Alert and oriented x3;  grossly normal neurologically. Skin:  Intact without significant lesions or rashes. No jaundice. Lymph Nodes:  No significant cervical adenopathy. Psych:  Alert and cooperative. Normal mood and affect.   Labs: Recent labs in Care Everywhere, from August 2021 reviewed  Imaging Studies: No results found.  Assessment and Plan:   Sara Murphy is a 74 y.o. y/o female has been referred for diarrhea  Review of her previous records, even going back to 2016 shows that patient has had chronic diarrhea with random colon biopsies being negative for microscopic colitis on last colonoscopy  Patient states she currently in stool specimen ordered by her primary care doctor yesterday, results not available yet  We will await the results of the stool test and depending on results , we may need to order further testing to evaluate etiology of her diarrhea  Diarrhea is chronic and unlikely to be  infectious  Patient advised to start Metamucil daily to help bulk stool Continue Imodium as needed  Patient also advised to avoid lactose completely for 2 weeks as she drinks at least 2 glasses of milk daily and may have underlying lactose intolerance causing her diarrhea  We will check celiac panel  Patient also reports increase bloating and gas foods, especially onions.  Low FODMAP diet discussed  Dr Sara Murphy  Speech recognition software was used to dictate the above note.

## 2019-09-24 NOTE — Addendum Note (Signed)
Addended by: Vonda Antigua on: 09/24/2019 12:43 PM   Modules accepted: Level of Service

## 2019-09-24 NOTE — Patient Instructions (Signed)
Please take Metamucil daily.  Lactose-Free Diet, Adult If you have lactose intolerance, you are not able to digest lactose. Lactose is a natural sugar found mainly in dairy milk and dairy products. You may need to avoid all foods and beverages that contain lactose. A lactose-free diet can help you do this. Which foods have lactose? Lactose is found in dairy milk and dairy products, such as:  Yogurt.  Cheese.  Butter.  Margarine.  Sour cream.  Cream.  Whipped toppings and nondairy creamers.  Ice cream and other dairy-based desserts. Lactose is also found in foods or products made with dairy milk or milk ingredients. To find out whether a food contains dairy milk or a milk ingredient, look at the ingredients list. Avoid foods with the statement "May contain milk" and foods that contain:  Milk powder.  Whey.  Curd.  Caseinate.  Lactose.  Lactalbumin.  Lactoglobulin. What are alternatives to dairy milk and foods made with milk products?  Lactose-free milk.  Soy milk with added calcium and vitamin D.  Almond milk, coconut milk, rice milk, or other nondairy milk alternatives with added calcium and vitamin D. Note that these are low in protein.  Soy products, such as soy yogurt, soy cheese, soy ice cream, and soy-based sour cream.  Other nut milk products, such as almond yogurt, almond cheese, cashew yogurt, cashew cheese, cashew ice cream, coconut yogurt, and coconut ice cream. What are tips for following this plan?  Do not consume foods, beverages, vitamins, minerals, or medicines containing lactose. Read ingredient lists carefully.  Look for the words "lactose-free" on labels.  Use lactase enzyme drops or tablets as directed by your health care provider.  Use lactose-free milk or a milk alternative, such as soy milk or almond milk, for drinking and cooking.  Make sure you get enough calcium and vitamin D in your diet. A lactose-free eating plan can be lacking in  these important nutrients.  Take calcium and vitamin D supplements as directed by your health care provider. Talk to your health care provider about supplements if you are not able to get enough calcium and vitamin D from food. What foods can I eat?  Fruits All fresh, canned, frozen, or dried fruits that are not processed with lactose. Vegetables All fresh, frozen, and canned vegetables without cheese, cream, or butter sauces. Grains Any that are not made with dairy milk or dairy products. Meats and other proteins Any meat, fish, poultry, and other protein sources that are not made with dairy milk or dairy products. Soy cheese and yogurt. Fats and oils Any that are not made with dairy milk or dairy products. Beverages Lactose-free milk. Soy, rice, or almond milk with added calcium and vitamin D. Fruit and vegetable juices. Sweets and desserts Any that are not made with dairy milk or dairy products. Seasonings and condiments Any that are not made with dairy milk or dairy products. Calcium Calcium is found in many foods that contain lactose and is important for bone health. The amount of calcium you need depends on your age:  Adults younger than 50 years: 1,000 mg of calcium a day.  Adults older than 50 years: 1,200 mg of calcium a day. If you are not getting enough calcium, you may get it from other sources, including:  Orange juice with calcium added. There are 300-350 mg of calcium in 1 cup of orange juice.  Calcium-fortified soy milk. There are 300-400 mg of calcium in 1 cup of calcium-fortified soy milk.  Calcium-fortified rice or almond milk. There are 300 mg of calcium in 1 cup of calcium-fortified rice or almond milk.  Calcium-fortified breakfast cereals. There are 100-1,000 mg of calcium in calcium-fortified breakfast cereals.  Spinach, cooked. There are 145 mg of calcium in  cup of cooked spinach.  Edamame, cooked. There are 130 mg of calcium in  cup of cooked  edamame.  Collard greens, cooked. There are 125 mg of calcium in  cup of cooked collard greens.  Kale, frozen or cooked. There are 90 mg of calcium in  cup of cooked or frozen kale.  Almonds. There are 95 mg of calcium in  cup of almonds.  Broccoli, cooked. There are 60 mg of calcium in 1 cup of cooked broccoli. The items listed above may not be a complete list of recommended foods and beverages. Contact a dietitian for more options. What foods are not recommended? Fruits None, unless they are made with dairy milk or dairy products. Vegetables None, unless they are made with dairy milk or dairy products. Grains Any grains that are made with dairy milk or dairy products. Meats and other proteins None, unless they are made with dairy milk or dairy products. Dairy All dairy products, including milk, goat's milk, buttermilk, kefir, acidophilus milk, flavored milk, evaporated milk, condensed milk, dulce de Conrad, eggnog, yogurt, cheese, and cheese spreads. Fats and oils Any that are made with milk or milk products. Margarines and salad dressings that contain milk or cheese. Cream. Half and half. Cream cheese. Sour cream. Chip dips made with sour cream or yogurt. Beverages Hot chocolate. Cocoa with lactose. Instant iced teas. Powdered fruit drinks. Smoothies made with dairy milk or yogurt. Sweets and desserts Any that are made with milk or milk products. Seasonings and condiments Chewing gum that has lactose. Spice blends if they contain lactose. Artificial sweeteners that contain lactose. Nondairy creamers. The items listed above may not be a complete list of foods and beverages to avoid. Contact a dietitian for more information. Summary  If you are lactose intolerant, it means that you have a hard time digesting lactose, a natural sugar found in milk and milk products.  Following a lactose-free diet can help you manage this condition.  Calcium is important for bone health and is  found in many foods that contain lactose. Talk with your health care provider about other sources of calcium. This information is not intended to replace advice given to you by your health care provider. Make sure you discuss any questions you have with your health care provider. Document Revised: 02/07/2017 Document Reviewed: 02/07/2017 Elsevier Patient Education  [place].   Psyllium granules or powder for solution What is this medicine? PSYLLIUM (SIL i yum) is a bulk-forming fiber laxative. This medicine is used to treat constipation. Increasing fiber in the diet may also help lower cholesterol and promote heart health for some people. This medicine may be used for other purposes; ask your health care provider or pharmacist if you have questions. COMMON BRAND NAME(S): Fiber Therapy, GenFiber, Geri-Mucil, Hydrocil, Konsyl, Metamucil, Metamucil MultiHealth, Mucilin, Natural Fiber Therapy, Reguloid What should I tell my health care provider before I take this medicine? They need to know if you have any of these conditions:  blockage in your bowel  difficulty swallowing  inflammatory bowel disease  phenylketonuria  stomach or intestine problems  sudden change in bowel habits lasting more than 2 weeks  an unusual or allergic reaction to psyllium, other medicines, dyes, or preservatives  pregnant or trying or get pregnant  breast-feeding How should I use this medicine? Mix this medicine into a full glass (240 mL) of water or other cool drink. Take this medicine by mouth. Follow the directions on the package labeling, or take as directed by your health care professional. Take your medicine at regular intervals. Do not take your medicine more often than directed. Talk to your pediatrician regarding the use of this medicine in children. While this drug may be prescribed for children as young as 29 years old for selected conditions, precautions do apply. Overdosage: If you think  you have taken too much of this medicine contact a poison control center or emergency room at once. NOTE: This medicine is only for you. Do not share this medicine with others. What if I miss a dose? If you miss a dose, take it as soon as you can. If it is almost time for your next dose, take only that dose. Do not take double or extra doses. What may interact with this medicine? Interactions are not expected. Take this product at least 2 hours before or after other medicines. This list may not describe all possible interactions. Give your health care provider a list of all the medicines, herbs, non-prescription drugs, or dietary supplements you use. Also tell them if you smoke, drink alcohol, or use illegal drugs. Some items may interact with your medicine. What should I watch for while using this medicine? Check with your doctor or health care professional if your symptoms do not start to get better or if they get worse. Stop using this medicine and contact your doctor or health care professional if you have rectal bleeding or if you have to treat your constipation for more than 1 week. These could be signs of a more serious condition. Drink several glasses of water a day while you are taking this medicine. This will help to relieve constipation and prevent dehydration. What side effects may I notice from receiving this medicine? Side effects that you should report to your doctor or health care professional as soon as possible:  allergic reactions like skin rash, itching or hives, swelling of the face, lips, or tongue  breathing problems  chest pain  nausea, vomiting  rectal bleeding  trouble swallowing Side effects that usually do not require medical attention (report to your doctor or health care professional if they continue or are bothersome):  bloating  gas  stomach cramps This list may not describe all possible side effects. Call your doctor for medical advice about side  effects. You may report side effects to FDA at 1-800-FDA-1088. Where should I keep my medicine? Keep out of the reach of children. Store at room temperature between 15 and 30 degrees C (59 and 86 degrees F). Protect from moisture. Throw away any unused medicine after the expiration date. NOTE: This sheet is a summary. It may not cover all possible information. If you have questions about this medicine, talk to your doctor, pharmacist, or health care provider.  2020 Elsevier/Gold Standard (2017-06-06 15:41:08) Low-FODMAP Eating Plan  FODMAPs (fermentable oligosaccharides, disaccharides, monosaccharides, and polyols) are sugars that are hard for some people to digest. A low-FODMAP eating plan may help some people who have bowel (intestinal) diseases to manage their symptoms. This meal plan can be complicated to follow. Work with a diet and nutrition specialist (dietitian) to make a low-FODMAP eating plan that is right for you. A dietitian can make sure that you get enough nutrition from this  diet. What are tips for following this plan? Reading food labels  Check labels for hidden FODMAPs such as: ? High-fructose syrup. ? Honey. ? Agave. ? Natural fruit flavors. ? Onion or garlic powder.  Choose low-FODMAP foods that contain 3-4 grams of fiber per serving.  Check food labels for serving sizes. Eat only one serving at a time to make sure FODMAP levels stay low. Meal planning  Follow a low-FODMAP eating plan for up to 6 weeks, or as told by your health care provider or dietitian.  To follow the eating plan: 1. Eliminate high-FODMAP foods from your diet completely. 2. Gradually reintroduce high-FODMAP foods into your diet one at a time. Most people should wait a few days after introducing one high-FODMAP food before they introduce the next high-FODMAP food. Your dietitian can recommend how quickly you may reintroduce foods. 3. Keep a daily record of what you eat and drink, and make note of  any symptoms that you have after eating. 4. Review your daily record with a dietitian regularly. Your dietitian can help you identify which foods you can eat and which foods you should avoid. General tips  Drink enough fluid each day to keep your urine pale yellow.  Avoid processed foods. These often have added sugar and may be high in FODMAPs.  Avoid most dairy products, whole grains, and sweeteners.  Work with a dietitian to make sure you get enough fiber in your diet. Recommended foods Grains  Gluten-free grains, such as rice, oats, buckwheat, quinoa, corn, polenta, and millet. Gluten-free pasta, bread, or cereal. Rice noodles. Corn tortillas. Vegetables  Eggplant, zucchini, cucumber, peppers, green beans, Brussels sprouts, bean sprouts, lettuce, arugula, kale, Swiss chard, spinach, collard greens, bok choy, summer squash, potato, and tomato. Limited amounts of corn, carrot, and sweet potato. Green parts of scallions. Fruits  Bananas, oranges, lemons, limes, blueberries, raspberries, strawberries, grapes, cantaloupe, honeydew melon, kiwi, papaya, passion fruit, and pineapple. Limited amounts of dried cranberries, banana chips, and shredded coconut. Dairy  Lactose-free milk, yogurt, and kefir. Lactose-free cottage cheese and ice cream. Non-dairy milks, such as almond, coconut, hemp, and rice milk. Yogurts made of non-dairy milks. Limited amounts of goat cheese, brie, mozzarella, parmesan, swiss, and other hard cheeses. Meats and other protein foods  Unseasoned beef, pork, poultry, or fish. Eggs. Berniece Salines. Tofu (firm) and tempeh. Limited amounts of nuts and seeds, such as almonds, walnuts, [place] nuts, pecans, peanuts, pumpkin seeds, chia seeds, and sunflower seeds. Fats and oils  Butter-free spreads. Vegetable oils, such as olive, canola, and sunflower oil. Seasoning and other foods  Artificial sweeteners with names that do not end in "ol" such as aspartame, saccharine, and stevia.  Maple syrup, white table sugar, raw sugar, brown sugar, and molasses. Fresh basil, coriander, parsley, rosemary, and thyme. Beverages  Water and mineral water. Sugar-sweetened soft drinks. Small amounts of orange juice or cranberry juice. Black and green tea. Most dry wines. Coffee. This may not be a complete list of low-FODMAP foods. Talk with your dietitian for more information. Foods to avoid Grains  Wheat, including kamut, durum, and semolina. Barley and bulgur. Couscous. Wheat-based cereals. Wheat noodles, bread, crackers, and pastries. Vegetables  Chicory root, artichoke, asparagus, cabbage, snow peas, sugar snap peas, mushrooms, and cauliflower. Onions, garlic, leeks, and the white part of scallions. Fruits  Fresh, dried, and juiced forms of apple, pear, watermelon, peach, plum, cherries, apricots, blackberries, boysenberries, figs, nectarines, and mango. Avocado. Dairy  Milk, yogurt, ice cream, and soft cheese. Cream and sour cream.  Milk-based sauces. Custard. Meats and other protein foods  Fried or fatty meat. Sausage. Cashews and pistachios. Soybeans, baked beans, black beans, chickpeas, kidney beans, fava beans, navy beans, lentils, and split peas. Seasoning and other foods  Any sugar-free gum or candy. Foods that contain artificial sweeteners such as sorbitol, mannitol, isomalt, or xylitol. Foods that contain honey, high-fructose corn syrup, or agave. Bouillon, vegetable stock, beef stock, and chicken stock. Garlic and onion powder. Condiments made with onion, such as hummus, chutney, pickles, relish, salad dressing, and salsa. Tomato paste. Beverages  Chicory-based drinks. Coffee substitutes. Chamomile tea. Fennel tea. Sweet or fortified wines such as port or sherry. Diet soft drinks made with isomalt, mannitol, maltitol, sorbitol, or xylitol. Apple, pear, and mango juice. Juices with high-fructose corn syrup. This may not be a complete list of high-FODMAP foods. Talk with  your dietitian to discuss what dietary choices are best for you.  Summary  A low-FODMAP eating plan is a short-term diet that eliminates FODMAPs from your diet to help ease symptoms of certain bowel diseases.  The eating plan usually lasts up to 6 weeks. After that, high-FODMAP foods are restarted gradually, one at a time, so you can find out which may be causing symptoms.  A low-FODMAP eating plan can be complicated. It is best to work with a dietitian who has experience with this type of plan. This information is not intended to replace advice given to you by your health care provider. Make sure you discuss any questions you have with your health care provider. Document Revised: 12/23/2016 Document Reviewed: 09/06/2016 Elsevier Patient Education  [place].

## 2019-09-25 LAB — CELIAC DISEASE AB SCREEN W/RFX
Antigliadin Abs, IgA: 4 units (ref 0–19)
IgA/Immunoglobulin A, Serum: 342 mg/dL (ref 64–422)
Transglutaminase IgA: <2 U/mL (ref 0–3)

## 2019-09-27 ENCOUNTER — Other Ambulatory Visit: Payer: Self-pay

## 2019-09-27 ENCOUNTER — Ambulatory Visit: Payer: Medicare PPO | Admitting: Urology

## 2019-09-27 ENCOUNTER — Encounter: Payer: Self-pay | Admitting: Urology

## 2019-09-27 ENCOUNTER — Other Ambulatory Visit: Payer: Self-pay | Admitting: Urology

## 2019-09-27 VITALS — BP 170/78 | HR 71 | Ht 62.0 in | Wt 112.9 lb

## 2019-09-27 DIAGNOSIS — R300 Dysuria: Secondary | ICD-10-CM | POA: Diagnosis not present

## 2019-09-27 DIAGNOSIS — N12 Tubulo-interstitial nephritis, not specified as acute or chronic: Secondary | ICD-10-CM

## 2019-09-27 DIAGNOSIS — R3129 Other microscopic hematuria: Secondary | ICD-10-CM | POA: Diagnosis not present

## 2019-09-27 LAB — URINALYSIS, COMPLETE
Bilirubin, UA: NEGATIVE
Glucose, UA: NEGATIVE
Ketones, UA: NEGATIVE
Nitrite, UA: NEGATIVE
Protein,UA: NEGATIVE
Specific Gravity, UA: 1.015 (ref 1.005–1.030)
Urobilinogen, Ur: 0.2 mg/dL (ref 0.2–1.0)
pH, UA: 6 (ref 5.0–7.5)

## 2019-09-27 LAB — MICROSCOPIC EXAMINATION: Bacteria, UA: NONE SEEN

## 2019-09-27 NOTE — Patient Instructions (Signed)

## 2019-09-27 NOTE — Progress Notes (Addendum)
09/27/2019 2:51 PM   Sara Murphy Jul 27, 1945 [id number]  Referring provider: Hortencia Murphy, [place],  [place] [address]  Chief Complaint  Patient presents with  . Dysuria    HPI:  Sara Murphy was referred for possible UTI and abnormal CT scan.  She was recently been evaluated for diarrhea. She has occasional dysuria. She underwent CT scan of the abdomen and pelvis September 20, 2019 at [facility] which was reported as asymmetric urothelial enhancement of the right collecting system "exclude infectious pyelitis".  No hydronephrosis or mass was described.  Her UA showed trace leukocyte esterase, negative nitrite, few bacteria. On microscopy she has 3 rbcs and wbc clumps. I did not see a urine culture. She has not been on abx and UA looks normal today - clear dip and microscopy no rbc, 6-10 wbc, no bacteria. No dysuria today. No gross hematuria. She has some urgency.    PMH: Past Medical History:  Diagnosis Date  . Cancer ([place])    basal cell and melanoma    Surgical History: Past Surgical History:  Procedure Laterality Date  . APPENDECTOMY    . BREAST BIOPSY Left 1980's &06/18/97   neg    Home Medications:  Allergies as of 09/27/2019      Reactions   Azithromycin Other (See Comments)   Other reaction(s): Other (See Comments) Gi upset Gi upset   Nsaids Other (See Comments)   Other reaction(s): Abdominal Pain With prolonged use. Able to take short term. Meloxicam did well for a few days. With prolonged use. Able to take short term. Meloxicam did well for a few days.   Erythromycin Nausea And Vomiting   Other Diarrhea   onions   Doxycycline Rash   Latex Rash      Medication List       Accurate as of September 27, 2019  2:51 PM. If you have any questions, ask your nurse or doctor.        carboxymethylcellulose 0.5 % Soln Commonly known as: REFRESH PLUS Place 1-2 drops into both eyes 1 day or 1 dose.   cetirizine 10 MG tablet Commonly known as:  ZYRTEC Take 10 mg by mouth daily.   doxepin 10 MG capsule Commonly known as: SINEQUAN Take 1 capsule by mouth at bedtime.   escitalopram 5 MG tablet Commonly known as: LEXAPRO Take 1 tablet by mouth daily.   fluticasone 50 MCG/ACT nasal spray Commonly known as: FLONASE Place 1 spray into both nostrils daily.   ipratropium 0.03 % nasal spray Commonly known as: ATROVENT Place 2 sprays into the nose in the morning and at bedtime.   levothyroxine 100 MCG tablet Commonly known as: SYNTHROID Take 100 mcg by mouth daily before breakfast.   lisinopril 2.5 MG tablet Commonly known as: ZESTRIL Take 5 mg by mouth.   montelukast 10 MG tablet Commonly known as: SINGULAIR   PROBIOTIC-10 PO Take by mouth daily.       Allergies:  Allergies  Allergen Reactions  . Azithromycin Other (See Comments)    Other reaction(s): Other (See Comments) Gi upset Gi upset   . Nsaids Other (See Comments)    Other reaction(s): Abdominal Pain With prolonged use. Able to take short term. Meloxicam did well for a few days. With prolonged use. Able to take short term. Meloxicam did well for a few days.   . Erythromycin Nausea And Vomiting  . Other Diarrhea    onions  . Doxycycline Rash  . Latex Rash  Family History: Family History  Problem Relation Age of Onset  . Breast cancer Cousin 30    Social History:  reports that she has never smoked. She has never used smokeless tobacco. She reports current alcohol use. She reports that she does not use drugs.   Physical Exam: BP (!) 170/78 (BP Location: Left Arm, Patient Position: Sitting, Cuff Size: Normal)   Pulse 71   Ht 5\' 2"  (1.575 m)   Wt 112 lb 14.4 oz (51.2 kg)   BMI 20.65 kg/m   Constitutional:  Alert and oriented, No acute distress. HEENT: [place] AT, moist mucus membranes.  Trachea midline, no masses. Cardiovascular: No clubbing, cyanosis, or edema. Respiratory: Normal respiratory effort, no increased work of breathing. GI:  Abdomen is soft, nontender, nondistended, no abdominal masses GU: No CVA tenderness Skin: No rashes, bruises or suspicious lesions. Neurologic: Grossly intact, no focal deficits, moving all 4 extremities. Psychiatric: Normal mood and affect.  Laboratory Data: Lab Results  Component Value Date   WBC 7.9 11/24/2011   HGB 12.0 11/24/2011   HCT 35.1 11/24/2011   MCV 93 11/24/2011   PLT 237 11/24/2011    Lab Results  Component Value Date   CREATININE 0.57 (L) 11/24/2011    No results found for: PSA  No results found for: TESTOSTERONE  No results found for: HGBA1C  Urinalysis    Component Value Date/Time   COLORURINE Colorless 11/24/2011 1519   APPEARANCEUR Clear 11/24/2011 1519   LABSPEC 1.003 11/24/2011 1519   PHURINE 7.0 11/24/2011 1519   GLUCOSEU Negative 11/24/2011 1519   HGBUR 1+ 11/24/2011 1519   BILIRUBINUR Negative 11/24/2011 1519   KETONESUR Negative 11/24/2011 1519   PROTEINUR Negative 11/24/2011 1519   NITRITE Negative 11/24/2011 1519   LEUKOCYTESUR Negative 11/24/2011 1519    Lab Results  Component Value Date   BACTERIA NONE SEEN 11/24/2011    Pertinent Imaging: CT report reviewed.  No results found for this or any previous visit.  No results found for this or any previous visit.  No results found for this or any previous visit.  No results found for this or any previous visit.  No results found for this or any previous visit.  No results found for this or any previous visit.  No results found for this or any previous visit.  No results found for this or any previous visit.   Assessment & Plan:    1. Dysuria Mild and resolved. UA looks good today. Might be related to OAB.  - Urinalysis 2. MH, pyelitis  - I sent urine for cytology given the CT report - these findings can be benign or malignant. I also requested she go by [facility] and pick up a disc so that I can look at the films myself - she might need URS or repeat imaging or surveillance.  She will return for exam and cystoscopy to complete the eval.   Addendum 12/06/2019: Her 09/27/2019 urine cytology was negative for HG urothelial carcinoma, but contain benign urothelial cells, squamous cells admixed with neutrophils. I was able to access Sara Murphy's CT scan from 09/20/2019 on [facility] and reviewed the images.  This showed some mild enhancement of the right renal pelvis with no mass and no significant stranding.  There was a mild right UPJ appearance with no significant hydronephrosis.  Based on these findings and the fact that her urine cleared with antibiotics I think she can continue surveillance.  I would like to repeat a renal ultrasound at  the end of the month and I will have her follow-up to review the images and repeat a urinalysis.  No follow-ups on file.  Festus Aloe, MD  [facility] [address], [place], [place] [address] [phone]

## 2019-10-02 LAB — CYTOLOGY - NON PAP

## 2019-10-03 ENCOUNTER — Ambulatory Visit: Payer: Medicare PPO | Admitting: Gastroenterology

## 2019-10-25 ENCOUNTER — Other Ambulatory Visit: Payer: Self-pay | Admitting: Urology

## 2019-11-18 ENCOUNTER — Ambulatory Visit: Payer: Medicare PPO | Admitting: Gastroenterology

## 2019-11-22 ENCOUNTER — Other Ambulatory Visit: Payer: Self-pay | Admitting: Urology

## 2019-11-23 ENCOUNTER — Ambulatory Visit
Admission: EM | Admit: 2019-11-23 | Discharge: 2019-11-23 | Disposition: A | Discharge status: Home / Self Care | Payer: Medicare PPO | Attending: Internal Medicine | Admitting: Internal Medicine

## 2019-11-23 ENCOUNTER — Other Ambulatory Visit: Payer: Self-pay

## 2019-11-23 ENCOUNTER — Encounter: Payer: Self-pay | Admitting: Emergency Medicine

## 2019-11-23 ENCOUNTER — Ambulatory Visit (INDEPENDENT_AMBULATORY_CARE_PROVIDER_SITE_OTHER): Payer: Medicare PPO

## 2019-11-23 DIAGNOSIS — R509 Fever, unspecified: Secondary | ICD-10-CM

## 2019-11-23 DIAGNOSIS — R5383 Other fatigue: Secondary | ICD-10-CM | POA: Diagnosis not present

## 2019-11-23 DIAGNOSIS — R059 Cough, unspecified: Secondary | ICD-10-CM

## 2019-11-23 DIAGNOSIS — J029 Acute pharyngitis, unspecified: Secondary | ICD-10-CM | POA: Insufficient documentation

## 2019-11-23 DIAGNOSIS — Z20822 Contact with and (suspected) exposure to covid-19: Secondary | ICD-10-CM | POA: Diagnosis not present

## 2019-11-23 LAB — CBC WITH DIFFERENTIAL/PLATELET
Abs Immature Granulocytes: 0.02 10*3/uL (ref 0.00–0.07)
Basophils Absolute: 0 10*3/uL (ref 0.0–0.1)
Basophils Relative: 1 %
Eosinophils Absolute: 0.1 10*3/uL (ref 0.0–0.5)
Eosinophils Relative: 1 %
HCT: 35.1 % — ABNORMAL LOW (ref 36.0–46.0)
Hemoglobin: 12.4 g/dL (ref 12.0–15.0)
Immature Granulocytes: 0 %
Lymphocytes Relative: 23 %
Lymphs Abs: 1.5 10*3/uL (ref 0.7–4.0)
MCH: 32.8 pg (ref 26.0–34.0)
MCHC: 35.3 g/dL (ref 30.0–36.0)
MCV: 92.9 fL (ref 80.0–100.0)
Monocytes Absolute: 0.5 10*3/uL (ref 0.1–1.0)
Monocytes Relative: 8 %
Neutro Abs: 4.5 10*3/uL (ref 1.7–7.7)
Neutrophils Relative %: 67 %
Platelets: 259 10*3/uL (ref 150–400)
RBC: 3.78 MIL/uL — ABNORMAL LOW (ref 3.87–5.11)
RDW: 11.4 % — ABNORMAL LOW (ref 11.5–15.5)
WBC: 6.7 10*3/uL (ref 4.0–10.5)
nRBC: 0 % (ref 0.0–0.2)

## 2019-11-23 LAB — COMPREHENSIVE METABOLIC PANEL
ALT: 18 U/L (ref 0–44)
AST: 24 U/L (ref 15–41)
Albumin: 4 g/dL (ref 3.5–5.0)
Alkaline Phosphatase: 79 U/L (ref 38–126)
Anion gap: 9 (ref 5–15)
BUN: 18 mg/dL (ref 8–23)
CO2: 27 mmol/L (ref 22–32)
Calcium: 9.1 mg/dL (ref 8.9–10.3)
Chloride: 92 mmol/L — ABNORMAL LOW (ref 98–111)
Creatinine, Ser: 0.62 mg/dL (ref 0.44–1.00)
GFR, Estimated: >60 mL/min (ref >60)
Glucose, Bld: 125 mg/dL — ABNORMAL HIGH (ref 70–99)
Potassium: 4 mmol/L (ref 3.5–5.1)
Sodium: 128 mmol/L — ABNORMAL LOW (ref 135–145)
Total Bilirubin: 0.5 mg/dL (ref 0.3–1.2)
Total Protein: 7 g/dL (ref 6.5–8.1)

## 2019-11-23 LAB — GROUP A STREP BY PCR: Group A Strep by PCR: NOT DETECTED

## 2019-11-23 NOTE — ED Triage Notes (Signed)
Patient c/o fatigue, sore throat, sinus congestion and nasal congestion for over a week.  Patient denies fevers.

## 2019-11-23 NOTE — Discharge Instructions (Addendum)
Due to your chronic conditions and if your covid test is positive, you qualify to receive Monoclonal Antibody infusion and if you don't hear from anyone within 24 hours of finding your results, call [phone] You have 10 days from the onset of your symptoms to benefit from this.  In the mean time: If your Covid test ends up positive you may take the following supplements to help your immune system be stronger to fight this viral infection Take Quarcetin 500 mg three times a day x 7 days with Zinc 50 mg ones a day x 7 days. The quarcetin is an antiviral and anti-inflammatory supplement which helps open the zinc channels in the cell to absorb Zinc. Zinc helps decrease the virus load in your body. Take Melatonin 6-10 mg at bed time which also helps support your immune system.  Also make sure to take Vit D 5,000 IU per day with a fatty meal and Vit C 1000 mg a day until you are completely better. Stay on Vitamin D 2,000  and C the rest of the season.  Don't lay around, keep active and walk as much as you are able to to prevent worsening of your symptoms.  Follow up with your family Dr next week.  If you get short of breath and you are able to check  your oxygen with a pulse oxygen meter, if it gets to 92% or less, you need to go to the hospital to be admitted. If you dont have one, come back here and we will assess you.

## 2019-11-23 NOTE — ED Provider Notes (Signed)
[facility]    CSN: [id number] Arrival date & time: 11/23/19  1313      History   Chief Complaint Chief Complaint  Patient presents with  . Sore Throat  . Sinus Problem  . Fatigue    HPI Sara Murphy is a 74 y.o. female who presents with complaints of having intermittent  ST, sinus congestion, fatigue, HA and body aches and diarrhea x 1 week. Has been sleeping a lot in the past 2 days. Denies loss of smell or taste. Does not have an appetite, but has made herself eat.  Has had Pfyzer covid injections this Spring.     Past Medical History:  Diagnosis Date  . Cancer ([place])    basal cell and melanoma    Patient Active Problem List   Diagnosis Date Noted  . Internal derangement of right knee 08/10/2017  . Primary osteoarthritis of right knee 08/10/2017  . Essential hypertension 07/10/2017  . Complex tear of medial meniscus of right knee as current injury 08/26/2015  . Situational anxiety 05/07/2015  . Diverticulitis of sigmoid colon 11/26/2014  . Facet arthritis of lumbar region 12/31/2013  . SI joint arthritis 12/10/2013  . Spinal stenosis of lumbar region with radiculopathy 12/10/2013  . Arthralgia of right hip 12/02/2013  . Spondylosis of lumbar region without myelopathy or radiculopathy 12/02/2013  . Hypercholesterolemia 11/26/2013  . Abnormal ECG 05/31/2013  . History of melanoma 05/17/2012  . Acquired hypothyroidism 09/13/2010  . Insomnia 09/13/2010    Past Surgical History:  Procedure Laterality Date  . APPENDECTOMY    . BREAST BIOPSY Left 1980's &06/18/97   neg  . EYE SURGERY      OB History   No obstetric history on file.      Home Medications    Prior to Admission medications   Medication Sig Start Date End Date Taking? Authorizing Provider  cetirizine (ZYRTEC) 10 MG tablet Take 10 mg by mouth daily.   Yes [provider]  escitalopram (LEXAPRO) 5 MG tablet Take 1 tablet by mouth daily. 01/24/19  Yes [provider]  fluticasone (FLONASE) 50 MCG/ACT nasal spray Place 1 spray into both nostrils daily.   Yes [provider]  levothyroxine (SYNTHROID, LEVOTHROID) 100 MCG tablet Take 100 mcg by mouth daily before breakfast.   Yes [provider]  lisinopril (PRINIVIL,ZESTRIL) 2.5 MG tablet Take 5 mg by mouth. 06/28/17 11/23/19 Yes [provider]  montelukast (SINGULAIR) 10 MG tablet  09/18/17  Yes [provider]  amLODipine (NORVASC) 5 MG tablet Take 5 mg by mouth daily. 10/23/19   [provider]  carboxymethylcellulose (REFRESH PLUS) 0.5 % SOLN Place 1-2 drops into both eyes 1 day or 1 dose.    [provider]  doxepin (SINEQUAN) 10 MG capsule Take 1 capsule by mouth at bedtime.  11/29/18 11/29/19  [provider]  ipratropium (ATROVENT) 0.03 % nasal spray Place 2 sprays into the nose in the morning and at bedtime. 07/21/19 07/20/20  [provider]    Family History Family History  Problem Relation Age of Onset  . Breast cancer Cousin 30    Social History Social History   Tobacco Use  . Smoking status: Never Smoker  . Smokeless tobacco: Never Used  Vaping Use  . Vaping Use: Never used  Substance Use Topics  . Alcohol use: Yes  . Drug use: Never     Allergies   Azithromycin, Nsaids, Erythromycin, Other, Doxycycline, and Latex   Review  of Systems Review of Systems  Constitutional: Positive for activity change, appetite change, chills, diaphoresis and fatigue. Negative for fever.  HENT: Positive for congestion, nosebleeds, postnasal drip and sore throat. Negative for ear discharge, ear pain, rhinorrhea and trouble swallowing.   Eyes: Negative for discharge.  Respiratory: Positive for cough. Negative for chest tightness and shortness of breath.   Cardiovascular: Negative for chest pain.  Gastrointestinal: Positive for diarrhea and nausea. Negative for abdominal pain and vomiting.  Musculoskeletal: Positive for  myalgias. Negative for gait problem.  Skin: Negative for rash.  Neurological: Positive for headaches.  Hematological: Negative for adenopathy.   Physical Exam Triage Vital Signs ED Triage Vitals  Enc Vitals Group     BP 11/23/19 1324 (!) 160/81     Pulse Rate 11/23/19 1324 76     Resp 11/23/19 1324 14     Temp 11/23/19 1324 98.3 F (36.8 C)     Temp Source 11/23/19 1324 Oral     SpO2 11/23/19 1324 99 %     Weight 11/23/19 1320 114 lb (51.7 kg)     Height 11/23/19 1320 5' 2.5" (1.588 m)     Head Circumference --      Peak Flow --      Pain Score 11/23/19 1320 2     Pain Loc --      Pain Edu? --      Excl. in [place]? --    No data found.  Updated Vital Signs BP (!) 160/81 (BP Location: Left Arm)   Pulse 76   Temp 98.3 F (36.8 C) (Oral)   Resp 14   Ht 5' 2.5" (1.588 m)   Wt 114 lb (51.7 kg)   SpO2 99%   BMI 20.52 kg/m   Visual Acuity Right Eye Distance:   Left Eye Distance:   Bilateral Distance:    Right Eye Near:   Left Eye Near:    Bilateral Near:     Physical Exam Vitals and nursing note reviewed.  Constitutional:      General: She is not in acute distress.    Appearance: She is normal weight. She is not toxic-appearing.  HENT:     Head: Normocephalic.     Right Ear: Tympanic membrane and ear canal normal.     Left Ear: Tympanic membrane and ear canal normal.     Nose: Rhinorrhea present. No congestion.     Comments: Sinuses are tender    Mouth/Throat:     Mouth: Mucous membranes are moist.     Pharynx: Oropharynx is clear. Uvula midline. No pharyngeal swelling, oropharyngeal exudate, posterior oropharyngeal erythema or uvula swelling.  Eyes:     Conjunctiva/sclera: Conjunctivae normal.  Cardiovascular:     Rate and Rhythm: Normal rate and regular rhythm.     Heart sounds: No murmur heard.   Pulmonary:     Effort: Pulmonary effort is normal.  Abdominal:     General: Bowel sounds are normal. There is no distension.     Palpations: Abdomen is soft.  There is no mass.     Tenderness: There is no abdominal tenderness. There is no guarding or rebound.  Musculoskeletal:     Cervical back: Neck supple.  Lymphadenopathy:     Cervical: No cervical adenopathy.  Skin:    General: Skin is warm and dry.     Findings: No rash.  Neurological:     Mental Status: She is alert and oriented to person, place, and time.  Psychiatric:  Mood and Affect: Mood normal.        Behavior: Behavior normal.      UC Treatments / Results  Labs (all labs ordered are listed, but only abnormal results are displayed) Labs Reviewed  CBC WITH DIFFERENTIAL/PLATELET - Abnormal; Notable for the following components:      Result Value   RBC 3.78 (*)    HCT 35.1 (*)    RDW 11.4 (*)    All other components within normal limits  COMPREHENSIVE METABOLIC PANEL - Abnormal; Notable for the following components:   Sodium 128 (*)    Chloride 92 (*)    Glucose, Bld 125 (*)    All other components within normal limits  GROUP A STREP BY PCR  SARS CORONAVIRUS 2 (TAT 6-24 HRS)    EKG   Radiology DG Chest 2 View  Result Date: 11/23/2019 CLINICAL DATA:  Fever and cough. Congestion and sore throat. Fatigue for 1 week. EXAM: CHEST - 2 VIEW COMPARISON:  04/28/2017 FINDINGS: Lungs are hyperinflated. There are no focal consolidations or pleural effusions. No pulmonary edema. Stable, chronic biapical pleuroparenchymal changes. IMPRESSION: Hyperinflation.  No evidence for acute  abnormality. Electronically Signed   By: Nolon Nations M.D.   On: 11/23/2019 14:09    Procedures Procedures (including critical care time)  Medications Ordered in UC Medications - No data to display  Initial Impression / Assessment and Plan / UC Course  I have reviewed the triage vital signs and the nursing notes. Covid test is pending Rapid strep is neg.  CBC with normal white count. CXR neg. CMP with slightly low sodium. Pt advised to increase her sodium intake and decrease water  intake and drink more sports drinks. May do Netie pot rinses for sinuses and Cepastat throat spray for ST.  She was educated abut MAB and is interested if her Covid test is positive See instructions.  Pertinent labs & imaging results that were available during my care of the patient were reviewed by me and considered in my medical decision making (see chart for details).  Final Clinical Impressions(s) / UC Diagnoses   Final diagnoses:  Viral pharyngitis  Suspected COVID-19 virus infection     Discharge Instructions     Due to your chronic conditions and if your covid test is positive, you qualify to receive Monoclonal Antibody infusion and if you don't hear from anyone within 24 hours of finding your results, call [phone] You have 10 days from the onset of your symptoms to benefit from this.  In the mean time: If your Covid test ends up positive you may take the following supplements to help your immune system be stronger to fight this viral infection Take Quarcetin 500 mg three times a day x 7 days with Zinc 50 mg ones a day x 7 days. The quarcetin is an antiviral and anti-inflammatory supplement which helps open the zinc channels in the cell to absorb Zinc. Zinc helps decrease the virus load in your body. Take Melatonin 6-10 mg at bed time which also helps support your immune system.  Also make sure to take Vit D 5,000 IU per day with a fatty meal and Vit C 1000 mg a day until you are completely better. Stay on Vitamin D 2,000  and C the rest of the season.  Don't lay around, keep active and walk as much as you are able to to prevent worsening of your symptoms.  Follow up with your family Dr next week.  If you  get short of breath and you are able to check  your oxygen with a pulse oxygen meter, if it gets to 92% or less, you need to go to the hospital to be admitted. If you dont have one, come back here and we will assess you.      ED Prescriptions    None     PDMP not  reviewed this encounter.   Shelby Mattocks, PA-C 11/23/19 1456

## 2019-11-24 LAB — SARS CORONAVIRUS 2 (TAT 6-24 HRS): SARS Coronavirus 2: NEGATIVE

## 2019-12-06 ENCOUNTER — Telehealth: Payer: Self-pay

## 2019-12-06 ENCOUNTER — Other Ambulatory Visit: Payer: Self-pay | Admitting: Urology

## 2019-12-06 NOTE — Addendum Note (Signed)
Addended by: Festus Aloe R on: 12/06/2019 09:55 AM   Modules accepted: Orders

## 2019-12-06 NOTE — Telephone Encounter (Signed)
Per Dr Junious Silk via secure chat, "Please call Sara Murphy and let her know I was able to access her CT scan at [facility] and reviewed the images. I thought it looked like she may have had an infection which seems to have cleared. She does not need cystoscopy, but we do need to get a renal ultrasound in 2 to 3 weeks and have her follow-up for me to review and check another urinalysis. Thanks. I ordered the renal ultrasound."  Notified patient as advised. Scheduled patient for follow up with [facility] 12/10. Appt reminder printed and mailed. Patient verbalized understanding.

## 2019-12-17 ENCOUNTER — Ambulatory Visit: Payer: Medicare PPO | Admitting: Gastroenterology

## 2019-12-24 ENCOUNTER — Ambulatory Visit
Admission: RE | Admit: 2019-12-24 | Discharge: 2019-12-24 | Disposition: A | Discharge status: Home / Self Care | Payer: Medicare PPO | Source: Ambulatory Visit | Attending: Urology | Admitting: Urology

## 2019-12-24 ENCOUNTER — Other Ambulatory Visit: Payer: Self-pay

## 2019-12-24 DIAGNOSIS — R3129 Other microscopic hematuria: Secondary | ICD-10-CM

## 2020-01-03 ENCOUNTER — Ambulatory Visit: Payer: Self-pay | Admitting: Urology

## 2020-02-02 ENCOUNTER — Other Ambulatory Visit: Payer: Self-pay

## 2020-02-02 ENCOUNTER — Encounter: Payer: Self-pay | Admitting: Emergency Medicine

## 2020-02-02 ENCOUNTER — Ambulatory Visit
Admission: EM | Admit: 2020-02-02 | Discharge: 2020-02-02 | Disposition: A | Discharge status: Other Acute Inpatient Hospital | Payer: Medicare PPO

## 2020-02-02 ENCOUNTER — Ambulatory Visit: Admit: 2020-02-02 | Discharge status: Absent without leave | Payer: Self-pay

## 2020-02-02 DIAGNOSIS — Z8719 Personal history of other diseases of the digestive system: Secondary | ICD-10-CM

## 2020-02-02 DIAGNOSIS — R197 Diarrhea, unspecified: Secondary | ICD-10-CM

## 2020-02-02 DIAGNOSIS — R1032 Left lower quadrant pain: Secondary | ICD-10-CM | POA: Diagnosis not present

## 2020-02-02 DIAGNOSIS — R110 Nausea: Secondary | ICD-10-CM

## 2020-02-02 MED ORDER — ONDANSETRON 8 MG PO TBDP
8.0000 mg | ORAL_TABLET | Freq: Once | ORAL | Status: AC
  Administered 2020-02-02: 8 mg via ORAL

## 2020-02-02 NOTE — ED Provider Notes (Signed)
[facility]    CSN: [id number] Arrival date & time: 02/02/20  1033      History   Chief Complaint Chief Complaint  Patient presents with  . Abdominal Pain    HPI Sara Murphy is a 75 y.o. female.   Sara Murphy presents with complaints of abdominal pain, to left lower quadrant, which has been worsening since 1/4. Started with diarrhea which was worse than her typical diarrhea, was frequent. She has since become weak. States that due to pain she feels like she is going to pass out. No vomiting. Pain becomes so severe she gets nauseated. No specific known fevers. Black to stool but had taken pepto-bismol. Imodium has helped with diarrhea. No URI symptoms. She has had diverticulitis in the past and this feels similar, although pain is worse. Pain is worsening rather than any improvement. States she has had to be hospitalized due to colonic rupture related to her colonoscopy, in the past. She has had minimal PO intake due to pain.     ROS per HPI, negative if not otherwise mentioned.      Past Medical History:  Diagnosis Date  . Cancer ([place])    basal cell and melanoma    Patient Active Problem List   Diagnosis Date Noted  . Internal derangement of right knee 08/10/2017  . Primary osteoarthritis of right knee 08/10/2017  . Essential hypertension 07/10/2017  . Complex tear of medial meniscus of right knee as current injury 08/26/2015  . Situational anxiety 05/07/2015  . Diverticulitis of sigmoid colon 11/26/2014  . Facet arthritis of lumbar region 12/31/2013  . SI joint arthritis 12/10/2013  . Spinal stenosis of lumbar region with radiculopathy 12/10/2013  . Arthralgia of right hip 12/02/2013  . Spondylosis of lumbar region without myelopathy or radiculopathy 12/02/2013  . Hypercholesterolemia 11/26/2013  . Abnormal ECG 05/31/2013  . History of melanoma 05/17/2012  . Acquired hypothyroidism 09/13/2010  . Insomnia 09/13/2010    Past  Surgical History:  Procedure Laterality Date  . APPENDECTOMY    . BREAST BIOPSY Left 1980's &06/18/97   neg  . EYE SURGERY      OB History   No obstetric history on file.      Home Medications    Prior to Admission medications   Medication Sig Start Date End Date Taking? Authorizing Provider  amLODipine (NORVASC) 5 MG tablet Take 5 mg by mouth daily. 10/23/19  Yes [provider]  escitalopram (LEXAPRO) 5 MG tablet Take 1 tablet by mouth daily. 01/24/19  Yes [provider]  levothyroxine (SYNTHROID, LEVOTHROID) 100 MCG tablet Take 100 mcg by mouth daily before breakfast.   Yes [provider]  lisinopril (ZESTRIL) 20 MG tablet Take 20 mg by mouth daily.   Yes [provider]  montelukast (SINGULAIR) 10 MG tablet  09/18/17  Yes [provider]  carboxymethylcellulose (REFRESH PLUS) 0.5 % SOLN Place 1-2 drops into both eyes 1 day or 1 dose.    [provider]  cetirizine (ZYRTEC) 10 MG tablet Take 10 mg by mouth daily.    [provider]  doxepin (SINEQUAN) 10 MG capsule Take 1 capsule by mouth at bedtime.  11/29/18 11/29/19  [provider]  fluticasone (FLONASE) 50 MCG/ACT nasal spray Place 1 spray into both nostrils daily.    [provider]  ipratropium (ATROVENT) 0.03 % nasal spray Place 2 sprays into the nose in the morning and at bedtime. 07/21/19 07/20/20  [provider]  lisinopril (PRINIVIL,ZESTRIL) 2.5 MG tablet Take 5 mg by mouth. 06/28/17 11/23/19  [provider]    Family History Family History  Problem Relation Age of Onset  . Breast cancer Cousin 30    Social History Social History   Tobacco Use  . Smoking status: Never Smoker  . Smokeless tobacco: Never Used  Vaping Use  . Vaping Use: Never used  Substance Use Topics  . Alcohol use: Yes  . Drug use: Never     Allergies   Azithromycin, Nsaids, Erythromycin, Other, Doxycycline, and Latex   Review of  Systems Review of Systems   Physical Exam Triage Vital Signs ED Triage Vitals  Enc Vitals Group     BP 02/02/20 1114 123/65     Pulse Rate 02/02/20 1114 65     Resp 02/02/20 1114 14     Temp 02/02/20 1114 98.7 F (37.1 C)     Temp Source 02/02/20 1114 Oral     SpO2 02/02/20 1114 99 %     Weight 02/02/20 1111 114 lb (51.7 kg)     Height 02/02/20 1111 5' 2.5" (1.588 m)     Head Circumference --      Peak Flow --      Pain Score 02/02/20 1110 5     Pain Loc --      Pain Edu? --      Excl. in [place]? --    No data found.  Updated Vital Signs BP 123/65 (BP Location: Left Arm)   Pulse 65   Temp 98.7 F (37.1 C) (Oral)   Resp 14   Ht 5' 2.5" (1.588 m)   Wt 114 lb (51.7 kg)   SpO2 99%   BMI 20.52 kg/m   Visual Acuity Right Eye Distance:   Left Eye Distance:   Bilateral Distance:    Right Eye Near:   Left Eye Near:    Bilateral Near:     Physical Exam Constitutional:      General: She is not in acute distress.    Appearance: She is well-developed. She is ill-appearing.     Comments: Laying on cart in apparent discomfort   Cardiovascular:     Rate and Rhythm: Normal rate.  Pulmonary:     Effort: Pulmonary effort is normal.  Abdominal:     Tenderness: There is abdominal tenderness in the left lower quadrant.     Comments: Severe abdominal pain to LLQ on even light palpation   Skin:    General: Skin is warm and dry.  Neurological:     Mental Status: She is alert and oriented to person, place, and time.      UC Treatments / Results  Labs (all labs ordered are listed, but only abnormal results are displayed) Labs Reviewed - No data to display  EKG   Radiology No results found.  Procedures Procedures (including critical care time)  Medications Ordered in UC Medications  ondansetron (ZOFRAN-ODT) disintegrating tablet 8 mg (has no administration in time range)    Initial Impression / Assessment and Plan / UC Course  I have reviewed the triage vital  signs and the nursing notes.  Pertinent labs & imaging results that were available during my care of the patient were reviewed by me and considered in my medical decision making (see chart for details).     Worsening LLQ abdominal pain, preceded by diarrhea. Concern for diverticulitis, with recommendations for ER evaluation to r/o abscess formation. Vitals stable, safe for private vehicle  transport- daughter will take her now. Patient verbalized understanding and agreeable to plan.   Final Clinical Impressions(s) / UC Diagnoses   Final diagnoses:  Left lower quadrant abdominal pain  History of diverticulitis  Nausea without vomiting  Diarrhea, unspecified type     Discharge Instructions     I feel you need to seek further evaluation and treatment in the Emergency Department due to the severity of your abdominal pain, with concern for diverticulitis.    ED Prescriptions    None     PDMP not reviewed this encounter.   Zigmund Gottron, NP 02/02/20 1204

## 2020-02-02 NOTE — ED Triage Notes (Signed)
Patient states that she had diarrhea that started on Tuesday.  Patient reports severe abdominal pain for the past 3 days.  Patient reports history of diverticulitis.  Patient reports low grade fevers.

## 2020-02-02 NOTE — ED Triage Notes (Signed)
Patient is being discharged from the Urgent Care and sent to the Emergency Department via POV . Per Augusto Gamble, NP, patient is in need of higher level of care due to severity of symptoms. Patient is aware and verbalizes understanding of plan of care.  Vitals:   02/02/20 1114  BP: 123/65  Pulse: 65  Resp: 14  Temp: 98.7 F (37.1 C)  SpO2: 99%

## 2020-02-02 NOTE — Discharge Instructions (Signed)
I feel you need to seek further evaluation and treatment in the Emergency Department due to the severity of your abdominal pain, with concern for diverticulitis.

## 2020-02-11 ENCOUNTER — Other Ambulatory Visit: Payer: Self-pay | Admitting: Family Medicine

## 2020-02-11 DIAGNOSIS — Z1231 Encounter for screening mammogram for malignant neoplasm of breast: Secondary | ICD-10-CM

## 2020-03-03 ENCOUNTER — Other Ambulatory Visit: Payer: Self-pay

## 2020-03-03 ENCOUNTER — Ambulatory Visit
Admission: RE | Admit: 2020-03-03 | Discharge: 2020-03-03 | Disposition: A | Discharge status: Home / Self Care | Payer: Medicare PPO | Source: Ambulatory Visit | Attending: Family Medicine | Admitting: Family Medicine

## 2020-03-03 DIAGNOSIS — Z1231 Encounter for screening mammogram for malignant neoplasm of breast: Secondary | ICD-10-CM | POA: Diagnosis not present

## 2020-04-09 ENCOUNTER — Other Ambulatory Visit: Payer: Self-pay | Admitting: Orthopedic Surgery

## 2020-04-09 DIAGNOSIS — M2351 Chronic instability of knee, right knee: Secondary | ICD-10-CM

## 2020-04-09 DIAGNOSIS — G8929 Other chronic pain: Secondary | ICD-10-CM

## 2020-04-09 DIAGNOSIS — M25561 Pain in right knee: Secondary | ICD-10-CM

## 2020-04-09 DIAGNOSIS — M2391 Unspecified internal derangement of right knee: Secondary | ICD-10-CM

## 2020-04-24 ENCOUNTER — Ambulatory Visit
Admission: RE | Admit: 2020-04-24 | Discharge: 2020-04-24 | Disposition: A | Discharge status: Home / Self Care | Payer: Medicare PPO | Source: Ambulatory Visit | Attending: Orthopedic Surgery | Admitting: Orthopedic Surgery

## 2020-04-24 ENCOUNTER — Other Ambulatory Visit: Payer: Self-pay

## 2020-04-24 DIAGNOSIS — M2351 Chronic instability of knee, right knee: Secondary | ICD-10-CM | POA: Diagnosis present

## 2020-04-24 DIAGNOSIS — G8929 Other chronic pain: Secondary | ICD-10-CM | POA: Diagnosis present

## 2020-04-24 DIAGNOSIS — M25561 Pain in right knee: Secondary | ICD-10-CM | POA: Insufficient documentation

## 2020-04-24 DIAGNOSIS — M2391 Unspecified internal derangement of right knee: Secondary | ICD-10-CM | POA: Insufficient documentation

## 2020-08-03 ENCOUNTER — Other Ambulatory Visit: Payer: Self-pay | Admitting: Family Medicine

## 2020-08-03 DIAGNOSIS — R002 Palpitations: Secondary | ICD-10-CM

## 2020-08-04 ENCOUNTER — Other Ambulatory Visit: Payer: Self-pay

## 2020-08-04 ENCOUNTER — Ambulatory Visit
Admission: RE | Admit: 2020-08-04 | Discharge: 2020-08-04 | Disposition: A | Discharge status: Home / Self Care | Payer: Medicare PPO | Source: Ambulatory Visit | Attending: Family Medicine | Admitting: Family Medicine

## 2020-08-04 DIAGNOSIS — Z136 Encounter for screening for cardiovascular disorders: Secondary | ICD-10-CM | POA: Diagnosis not present

## 2020-08-04 DIAGNOSIS — R002 Palpitations: Secondary | ICD-10-CM | POA: Diagnosis present

## 2020-08-04 LAB — NM MYOCAR MULTI W/SPECT W/WALL MOTION / EF
Estimated workload: 1 METS
Exercise duration (min): 1 min
Exercise duration (sec): 0 s
LV dias vol: 45 mL (ref 46–106)
LV sys vol: 17 mL
MPHR: 145 {beats}/min
Peak HR: 106 {beats}/min
Percent HR: 73 %
Rest HR: 70 {beats}/min
SDS: 0
SRS: 1
SSS: 0
TID: 1

## 2020-08-04 MED ORDER — TECHNETIUM TC 99M TETROFOSMIN IV KIT
10.0000 | PACK | Freq: Once | INTRAVENOUS | Status: AC | PRN
  Administered 2020-08-04: 9.77 via INTRAVENOUS

## 2020-08-04 MED ORDER — REGADENOSON 0.4 MG/5ML IV SOLN
0.4000 mg | Freq: Once | INTRAVENOUS | Status: AC
  Administered 2020-08-04: 0.4 mg via INTRAVENOUS

## 2020-08-04 MED ORDER — TECHNETIUM TC 99M TETROFOSMIN IV KIT
30.0000 | PACK | Freq: Once | INTRAVENOUS | Status: AC | PRN
  Administered 2020-08-04: 31.05 via INTRAVENOUS

## 2020-08-13 ENCOUNTER — Encounter: Payer: Self-pay | Admitting: Orthopedic Surgery

## 2020-08-13 ENCOUNTER — Other Ambulatory Visit: Payer: Self-pay | Admitting: Orthopedic Surgery

## 2020-08-18 ENCOUNTER — Ambulatory Visit: Payer: Medicare PPO | Admitting: Anesthesiology

## 2020-08-18 ENCOUNTER — Encounter: Payer: Self-pay | Admitting: Orthopedic Surgery

## 2020-08-18 ENCOUNTER — Encounter
Admission: RE | Disposition: A | Discharge status: Home / Self Care | Payer: Self-pay | Source: Home / Self Care | Attending: Orthopedic Surgery

## 2020-08-18 ENCOUNTER — Other Ambulatory Visit: Payer: Self-pay

## 2020-08-18 ENCOUNTER — Ambulatory Visit
Admission: RE | Admit: 2020-08-18 | Discharge: 2020-08-18 | Disposition: A | Discharge status: Home / Self Care | Payer: Medicare PPO | Attending: Orthopedic Surgery | Admitting: Orthopedic Surgery

## 2020-08-18 DIAGNOSIS — Y939 Activity, unspecified: Secondary | ICD-10-CM | POA: Diagnosis not present

## 2020-08-18 DIAGNOSIS — X58XXXA Exposure to other specified factors, initial encounter: Secondary | ICD-10-CM | POA: Diagnosis not present

## 2020-08-18 DIAGNOSIS — E739 Lactose intolerance, unspecified: Secondary | ICD-10-CM | POA: Diagnosis not present

## 2020-08-18 DIAGNOSIS — Z823 Family history of stroke: Secondary | ICD-10-CM | POA: Insufficient documentation

## 2020-08-18 DIAGNOSIS — S83281A Other tear of lateral meniscus, current injury, right knee, initial encounter: Secondary | ICD-10-CM | POA: Insufficient documentation

## 2020-08-18 DIAGNOSIS — Z833 Family history of diabetes mellitus: Secondary | ICD-10-CM | POA: Diagnosis not present

## 2020-08-18 DIAGNOSIS — Z79890 Hormone replacement therapy (postmenopausal): Secondary | ICD-10-CM | POA: Diagnosis not present

## 2020-08-18 DIAGNOSIS — Z809 Family history of malignant neoplasm, unspecified: Secondary | ICD-10-CM | POA: Insufficient documentation

## 2020-08-18 DIAGNOSIS — Z8042 Family history of malignant neoplasm of prostate: Secondary | ICD-10-CM | POA: Insufficient documentation

## 2020-08-18 DIAGNOSIS — M659 Synovitis and tenosynovitis, unspecified: Secondary | ICD-10-CM | POA: Insufficient documentation

## 2020-08-18 DIAGNOSIS — Z91040 Latex allergy status: Secondary | ICD-10-CM | POA: Diagnosis not present

## 2020-08-18 DIAGNOSIS — M6751 Plica syndrome, right knee: Secondary | ICD-10-CM | POA: Diagnosis not present

## 2020-08-18 DIAGNOSIS — Z8249 Family history of ischemic heart disease and other diseases of the circulatory system: Secondary | ICD-10-CM | POA: Insufficient documentation

## 2020-08-18 DIAGNOSIS — Z888 Allergy status to other drugs, medicaments and biological substances status: Secondary | ICD-10-CM | POA: Diagnosis not present

## 2020-08-18 DIAGNOSIS — Z79899 Other long term (current) drug therapy: Secondary | ICD-10-CM | POA: Diagnosis not present

## 2020-08-18 DIAGNOSIS — M1711 Unilateral primary osteoarthritis, right knee: Secondary | ICD-10-CM | POA: Diagnosis not present

## 2020-08-18 DIAGNOSIS — Z8261 Family history of arthritis: Secondary | ICD-10-CM | POA: Diagnosis not present

## 2020-08-18 DIAGNOSIS — Z881 Allergy status to other antibiotic agents status: Secondary | ICD-10-CM | POA: Insufficient documentation

## 2020-08-18 HISTORY — DX: Anemia, unspecified: D64.9

## 2020-08-18 HISTORY — PX: KNEE ARTHROSCOPY WITH MEDIAL MENISECTOMY: SHX5651

## 2020-08-18 HISTORY — DX: Essential (primary) hypertension: I10

## 2020-08-18 HISTORY — DX: Unspecified osteoarthritis, unspecified site: M19.90

## 2020-08-18 SURGERY — ARTHROSCOPY, KNEE, WITH MEDIAL MENISCECTOMY
Anesthesia: General | Site: Knee | Laterality: Right

## 2020-08-18 MED ORDER — DEXAMETHASONE SODIUM PHOSPHATE 4 MG/ML IJ SOLN
INTRAMUSCULAR | Status: DC | PRN
  Administered 2020-08-18: 4 mg via INTRAVENOUS

## 2020-08-18 MED ORDER — OXYCODONE HCL 5 MG PO TABS
5.0000 mg | ORAL_TABLET | Freq: Once | ORAL | Status: AC | PRN
  Administered 2020-08-18: 5 mg via ORAL

## 2020-08-18 MED ORDER — FENTANYL CITRATE (PF) 100 MCG/2ML IJ SOLN
INTRAMUSCULAR | Status: DC | PRN
  Administered 2020-08-18 (×2): 25 ug via INTRAVENOUS

## 2020-08-18 MED ORDER — LACTATED RINGERS IV SOLN: INTRAVENOUS | Status: DC

## 2020-08-18 MED ORDER — ACETAMINOPHEN 160 MG/5ML PO SOLN: 325.0000 mg | ORAL | Status: DC | PRN

## 2020-08-18 MED ORDER — GLYCOPYRROLATE 0.2 MG/ML IJ SOLN
INTRAMUSCULAR | Status: DC | PRN
  Administered 2020-08-18: .1 mg via INTRAVENOUS

## 2020-08-18 MED ORDER — ACETAMINOPHEN 325 MG PO TABS: 325.0000 mg | ORAL_TABLET | ORAL | Status: DC | PRN

## 2020-08-18 MED ORDER — HYDROCODONE-ACETAMINOPHEN 5-325 MG PO TABS: 1.0000 | ORAL_TABLET | ORAL | 0 refills | Status: DC | PRN

## 2020-08-18 MED ORDER — MIDAZOLAM HCL 5 MG/5ML IJ SOLN
INTRAMUSCULAR | Status: DC | PRN
  Administered 2020-08-18: 1 mg via INTRAVENOUS

## 2020-08-18 MED ORDER — OXYCODONE HCL 5 MG/5ML PO SOLN: 5.0000 mg | Freq: Once | ORAL | Status: AC | PRN

## 2020-08-18 MED ORDER — IBUPROFEN 800 MG PO TABS: 800.0000 mg | ORAL_TABLET | Freq: Three times a day (TID) | ORAL | 1 refills | Status: AC

## 2020-08-18 MED ORDER — FENTANYL CITRATE PF 50 MCG/ML IJ SOSY: 25.0000 ug | PREFILLED_SYRINGE | INTRAMUSCULAR | Status: DC | PRN

## 2020-08-18 MED ORDER — LIDOCAINE-EPINEPHRINE 1 %-1:100000 IJ SOLN
INTRAMUSCULAR | Status: DC | PRN
  Administered 2020-08-18: 12 mL via INTRAMUSCULAR

## 2020-08-18 MED ORDER — ACETAMINOPHEN 500 MG PO TABS: 1000.0000 mg | ORAL_TABLET | Freq: Three times a day (TID) | ORAL | 2 refills | Status: DC

## 2020-08-18 MED ORDER — ASPIRIN EC 325 MG PO TBEC: 325.0000 mg | DELAYED_RELEASE_TABLET | Freq: Every day | ORAL | 0 refills | Status: AC

## 2020-08-18 MED ORDER — ONDANSETRON 4 MG PO TBDP: 4.0000 mg | ORAL_TABLET | Freq: Three times a day (TID) | ORAL | 0 refills | Status: DC | PRN

## 2020-08-18 MED ORDER — LIDOCAINE HCL (CARDIAC) PF 100 MG/5ML IV SOSY
PREFILLED_SYRINGE | INTRAVENOUS | Status: DC | PRN
  Administered 2020-08-18: 40 mg via INTRATRACHEAL

## 2020-08-18 MED ORDER — PROPOFOL 10 MG/ML IV BOLUS
INTRAVENOUS | Status: DC | PRN
  Administered 2020-08-18 (×2): 50 mg via INTRAVENOUS
  Administered 2020-08-18: 100 mg via INTRAVENOUS

## 2020-08-18 MED ORDER — ONDANSETRON HCL 4 MG/2ML IJ SOLN
4.0000 mg | Freq: Once | INTRAMUSCULAR | Status: AC
  Administered 2020-08-18: 4 mg via INTRAVENOUS

## 2020-08-18 MED ORDER — CEFAZOLIN SODIUM-DEXTROSE 2-4 GM/100ML-% IV SOLN
2.0000 g | INTRAVENOUS | Status: AC
  Administered 2020-08-18: 2 g via INTRAVENOUS

## 2020-08-18 SURGICAL SUPPLY — 36 items
ADAPTER IRRIG TUBE 2 SPIKE SOL (ADAPTER) ×2 IMPLANT
ADPR TBG 2 SPK PMP STRL ASCP (ADAPTER) ×1
APL PRP STRL LF DISP 70% ISPRP (MISCELLANEOUS) ×1
BLADE SURG SZ11 CARB STEEL (BLADE) ×2 IMPLANT
BNDG COHESIVE 4X5 TAN STRL (GAUZE/BANDAGES/DRESSINGS) ×2 IMPLANT
BNDG ESMARK 6X12 TAN STRL LF (GAUZE/BANDAGES/DRESSINGS) ×2 IMPLANT
BUR RADIUS 4.0X18.5 (BURR) ×2 IMPLANT
CHLORAPREP W/TINT 26 (MISCELLANEOUS) ×2 IMPLANT
COOLER POLAR GLACIER W/PUMP (MISCELLANEOUS) ×2 IMPLANT
COVER LIGHT HANDLE UNIVERSAL (MISCELLANEOUS) ×4 IMPLANT
CUFF TOURN SGL QUICK 30 (TOURNIQUET CUFF) ×2
CUFF TRNQT CYL 30X4X21-28X (TOURNIQUET CUFF) ×1 IMPLANT
DRAPE IMP U-DRAPE 54X76 (DRAPES) ×2 IMPLANT
GAUZE SPONGE 4X4 12PLY STRL (GAUZE/BANDAGES/DRESSINGS) ×2 IMPLANT
GLOVE SRG 8 PF TXTR STRL LF DI (GLOVE) ×1 IMPLANT
GLOVE SURG ENC MOIS LTX SZ7.5 (GLOVE) ×2 IMPLANT
GLOVE SURG UNDER POLY LF SZ8 (GLOVE) ×2
GOWN STRL REUS W/ TWL LRG LVL3 (GOWN DISPOSABLE) ×1 IMPLANT
GOWN STRL REUS W/TWL LRG LVL3 (GOWN DISPOSABLE) ×2
GOWN STRL REUS W/TWL XL LVL3 (GOWN DISPOSABLE) ×2 IMPLANT
IV LACTATED RINGER IRRG 3000ML (IV SOLUTION) ×8
IV LR IRRIG 3000ML ARTHROMATIC (IV SOLUTION) ×4 IMPLANT
KIT TURNOVER KIT A (KITS) ×2 IMPLANT
MANIFOLD NEPTUNE II (INSTRUMENTS) ×2 IMPLANT
MAT ABSORB  FLUID 56X50 GRAY (MISCELLANEOUS) ×2
MAT ABSORB FLUID 56X50 GRAY (MISCELLANEOUS) ×1 IMPLANT
PACK ARTHROSCOPY KNEE (MISCELLANEOUS) ×2 IMPLANT
PAD ABD DERMACEA PRESS 5X9 (GAUZE/BANDAGES/DRESSINGS) ×2 IMPLANT
PAD WRAPON POLAR KNEE (MISCELLANEOUS) ×1 IMPLANT
SET TUBE SUCT SHAVER OUTFL 24K (TUBING) ×2 IMPLANT
SUT ETHILON 3-0 FS-10 30 BLK (SUTURE) ×2
SUTURE EHLN 3-0 FS-10 30 BLK (SUTURE) ×1 IMPLANT
TOWEL OR 17X26 4PK STRL BLUE (TOWEL DISPOSABLE) ×4 IMPLANT
TUBING ARTHRO INFLOW-ONLY STRL (TUBING) ×2 IMPLANT
WAND WEREWOLF FLOW 90D (MISCELLANEOUS) ×2 IMPLANT
WRAPON POLAR PAD KNEE (MISCELLANEOUS) ×2

## 2020-08-18 NOTE — Anesthesia Preprocedure Evaluation (Signed)
Anesthesia Evaluation  Patient identified by MRN, date of birth, ID band Patient awake    Reviewed: Allergy & Precautions, NPO status   Airway Mallampati: II  TM Distance: >3 FB     Dental   Pulmonary    Pulmonary exam normal        Cardiovascular hypertension,  Rhythm:Regular Rate:Normal     Neuro/Psych Anxiety    GI/Hepatic   Endo/Other  Hypothyroidism   Renal/GU      Musculoskeletal  (+) Arthritis ,   Abdominal   Peds  Hematology  (+) anemia ,   Anesthesia Other Findings   Reproductive/Obstetrics                             Anesthesia Physical Anesthesia Plan  ASA: 2  Anesthesia Plan: General   Post-op Pain Management:    Induction: Intravenous  PONV Risk Score and Plan: Treatment may vary due to age or medical condition  Airway Management Planned: LMA  Additional Equipment:   Intra-op Plan:   Post-operative Plan:   Informed Consent: I have reviewed the patients History and Physical, chart, labs and discussed the procedure including the risks, benefits and alternatives for the proposed anesthesia with the patient or authorized representative who has indicated his/her understanding and acceptance.     Dental advisory given  Plan Discussed with: CRNA  Anesthesia Plan Comments:         Anesthesia Quick Evaluation

## 2020-08-18 NOTE — Op Note (Signed)
Operative Note    SURGERY DATE: 08/18/2020   PRE-OP DIAGNOSIS:  1. Right lateral meniscus tear 2. Right knee synovitis 3. Right knee lateral compartment degenerative changes   POST-OP DIAGNOSIS:  1. Right lateral meniscus tear 2. Right knee synovitis and medial plica band 3. Right knee lateral compartment degenerative changes   PROCEDURES:  1. Right knee arthroscopy, partial lateral meniscectomy 2. Right knee partial synovectomy of medial plica band and infrapatellar fat pad 3. Right knee chondroplasty of lateral compartment   SURGEON: Cato Mulligan, MD   ANESTHESIA: Gen   ESTIMATED BLOOD LOSS: minimal   TOTAL IV FLUIDS: per anesthesia   INDICATION(S):  Sara Murphy is a 75 y.o. female with chronic right knee pain for over 8 months.  She has undergone medical management, corticosteroid injection, activity modifications, Physical therapy, and viscosupplementation injections without appropriate relief of knee pain.  MRI showed lateral meniscus tear, degenerative changes to the lateral compartment, and synovitis about the infrapatellar fat pad.  Given failure of nonoperative management and clinical/imaging findings after discussion of risks, benefits, and alternatives to surgery, the patient elected to proceed.   OPERATIVE FINDINGS:    Examination under anesthesia: A careful examination under anesthesia was performed.  Passive range of motion was: Hyperextension: 1.  Extension: 0.  Flexion: 130.  Lachman: normal. Pivot Shift: normal.  Posterior drawer: normal.  Varus stability in full extension: normal.  Varus stability in 30 degrees of flexion: normal.  Valgus stability in full extension: normal.  Valgus stability in 30 degrees of flexion: normal.   Intra-operative findings: A thorough arthroscopic examination of the knee was performed.  The findings are: 1. Suprapatellar pouch: Normal 2. Undersurface of median ridge: Grade 2 degenerative changes 3. Medial patellar facet:  Grade 1 softening 4. Lateral patellar facet: Grade 2 degenerative changes 5. Trochlea: Grade 1-2 degenerative changes centrally 6. Lateral gutter/popliteus tendon: Normal 7. Hoffa's fat pad: Inflamed with synovitis, primarily along the lateral aspect of the fat pad 8. Medial gutter/plica: Prominent medial plica band abutting the medial femoral condyle 9. ACL: Normal 10. PCL: Normal 11. Medial meniscus: Normal 12. Medial compartment cartilage: Grade 1-2 degenerative changes to the medial femoral condyle and tibial plateau 13. Lateral meniscus: Significant degeneration about the anterior horn with full width radial tear at the anterior horn/body junction 14. Lateral compartment cartilage: Extensive grade 4 degenerative changes to the femoral condyle and tibial plateau   OPERATIVE REPORT:     I identified Sara Murphy in the pre-operative holding area. I marked the operative knee with my initials. I reviewed the risks and benefits of the proposed surgical intervention and the patient wished to proceed. The patient was transferred to the operative suite and placed in the supine position with all bony prominences padded.  Anesthesia was administered. Appropriate IV antibiotics were administered prior to incision. The extremity was then prepped and draped in standard fashion. A time out was performed confirming the correct extremity, correct patient, and correct procedure.   Arthroscopy portals were marked. Local anesthetic was injected to the planned portal sites. The anterolateral portal was established with an 11 blade.      The arthroscope was placed in the anterolateral portal and then into the suprapatellar pouch. Next, the medial portal was established under needle localization. A diagnostic knee scope was completed with the above findings. The lateral meniscus tear was identified.   The meniscal tear was debrided using an arthroscopic biter and an oscillating shaver until the  meniscus had stable  borders.  A 70 degree arthroscope was utilized with viewing from the anteromedial portal to achieve better visualization.  A chondroplasty was performed of the lateral compartment such that there were stable cartilage edges without any loose fragments of cartilage.  Partial synovectomy was performed by excising the medial plica band using an oscillating shaver and by debriding the synovitic tissue about the infrapatellar fat pad.  This was also done using an oscillating shaver.  ArthroCare wand was used to coagulate any obvious sources of bleeding.  Arthroscopic fluid was removed from the joint.   The portals were closed with 3-0 Nylon suture. Sterile dressings included Xeroform, 4x4s, Sof-Rol, and Bias wrap. A Polarcare was placed.  The patient was then awakened and taken to the PACU hemodynamically stable without complication.     POSTOPERATIVE PLAN: The patient will be discharged home today once they meet PACU criteria. Aspirin 325 mg daily was prescribed for 2 weeks for DVT prophylaxis.  Physical therapy will start on POD#3-4. Weight-bearing as tolerated. Follow up in 2 weeks per protocol.

## 2020-08-18 NOTE — H&P (Signed)
Paper H&P to be scanned into permanent record. H&P reviewed. No significant changes noted.  

## 2020-08-18 NOTE — Anesthesia Procedure Notes (Signed)
Procedure Name: LMA Insertion Date/Time: 08/18/2020 1:49 PM Performed by: Mayme Genta, CRNA Pre-anesthesia Checklist: Patient identified, Emergency Drugs available, Suction available, Timeout performed and Patient being monitored Patient Re-evaluated:Patient Re-evaluated prior to induction Oxygen Delivery Method: Circle system utilized Preoxygenation: Pre-oxygenation with 100% oxygen Induction Type: IV induction LMA: LMA inserted LMA Size: 4.0 Number of attempts: 2 Placement Confirmation: positive ETCO2 and breath sounds checked- equal and bilateral Tube secured with: Tape Comments: Attempted with #3 LMA, unable to obtain an adequate seal. Inserted #4 LMA with good EtCO2 and chest rise.

## 2020-08-18 NOTE — Transfer of Care (Signed)
Immediate Anesthesia Transfer of Care Note  Patient: [name]  Procedure(s) Performed: Right knee arthroscopic partial lateral and/or medial meniscectomy with partial synovectomy (Right: Knee)  Patient Location: PACU  Anesthesia Type: General  Level of Consciousness: awake, alert  and patient cooperative  Airway and Oxygen Therapy: Patient Spontanous Breathing and Patient connected to supplemental oxygen  Post-op Assessment: Post-op Vital signs reviewed, Patient's Cardiovascular Status Stable, Respiratory Function Stable, Patent Airway and No signs of Nausea or vomiting  Post-op Vital Signs: Reviewed and stable  Complications: No notable events documented.

## 2020-08-18 NOTE — Discharge Instructions (Addendum)
Arthroscopic Knee Surgery - Partial Meniscectomy   Post-Op Instructions   1. Bracing or crutches: Crutches will be provided at the time of discharge from the surgery center if you do not already have them.   2. Ice: You may be provided with a device [facility]) that allows you to ice the affected area effectively. Otherwise you can ice manually.    3. Driving:  Plan on not driving for at least two weeks. Please note that you are advised NOT to drive while taking narcotic pain medications as you may be impaired and unsafe to drive.   4. Activity: Ankle pumps several times an hour while awake to prevent blood clots. Weight bearing: as tolerated. Use crutches for as needed (usually ~1 week or less) until pain allows you to ambulate without a limp. Bending and straightening the knee is unlimited. Elevate knee above heart level as much as possible for one week. Avoid standing more than 5 minutes (consecutively) for the first week.  Avoid long distance travel for 2 weeks.  5. Medications:  - You have been provided a prescription for narcotic pain medicine. After surgery, take 1-2 narcotic tablets every 4 hours if needed for severe pain.  - You may take up to '3000mg'$ /day of tylenol (acetaminophen). You can take '1000mg'$  3x/day. Please check your narcotic. If you have acetaminophen in your narcotic (each tablet will be '325mg'$ ), be careful not to exceed a total of '3000mg'$ /day of acetaminophen.  - A prescription for anti-nausea medication will be provided in case the narcotic medicine or anesthesia causes nausea - take 1 tablet every 6 hours only if nauseated.  - Take ibuprofen 800 mg every 8 hours WITH food to reduce post-operative knee swelling. However, please discontinue if you have any abdominal discomfort after taking this.  - Take enteric coated aspirin 325 mg once daily for 2 weeks to prevent blood clots.    6. Bandages: The physical therapist should change the bandages at the first post-op  appointment. If needed, the dressing supplies have been provided to you.   7. Physical Therapy: 1-2 times per week for 6 weeks. Therapy typically starts on post operative Day 3 or 4. You have been provided an order for physical therapy. The therapist will provide home exercises.   8. Work: May return to full work usually around 2 weeks after 1st post-operative visit. May do light duty/desk job in approximately 1-2 weeks when off of narcotics, pain is well-controlled, and swelling has decreased. Labor intensive jobs may require 4-6 weeks to return.      9. Post-Op Appointments: Your first post-op appointment will be with Dr. Posey Pronto in approximately 2 weeks time.    If you find that they have not been scheduled please call the Orthopaedic Appointment front desk at [phone].

## 2020-08-18 NOTE — Anesthesia Postprocedure Evaluation (Signed)
Anesthesia Post Note  Patient: Sara Murphy  Procedure(s) Performed: Right knee arthroscopic partial lateral and/or medial meniscectomy with partial synovectomy (Right: Knee)     Patient location during evaluation: PACU Anesthesia Type: General Level of consciousness: awake Pain management: pain level controlled Vital Signs Assessment: post-procedure vital signs reviewed and stable Respiratory status: respiratory function stable Cardiovascular status: stable Postop Assessment: no signs of nausea or vomiting Anesthetic complications: no   No notable events documented.  Veda Canning

## 2020-08-19 ENCOUNTER — Encounter: Payer: Self-pay | Admitting: Orthopedic Surgery

## 2020-09-27 DIAGNOSIS — K5792 Diverticulitis of intestine, part unspecified, without perforation or abscess without bleeding: Secondary | ICD-10-CM | POA: Insufficient documentation

## 2020-10-09 ENCOUNTER — Ambulatory Visit
Admission: RE | Admit: 2020-10-09 | Discharge: 2020-10-09 | Disposition: A | Discharge status: Home / Self Care | Payer: Medicare PPO | Attending: Family Medicine | Admitting: Family Medicine

## 2020-10-09 ENCOUNTER — Other Ambulatory Visit: Payer: Self-pay

## 2020-10-09 ENCOUNTER — Other Ambulatory Visit: Payer: Self-pay | Admitting: Family Medicine

## 2020-10-09 ENCOUNTER — Ambulatory Visit
Admission: RE | Admit: 2020-10-09 | Discharge: 2020-10-09 | Disposition: A | Discharge status: Home / Self Care | Payer: Medicare PPO | Source: Ambulatory Visit | Attending: Family Medicine | Admitting: Family Medicine

## 2020-10-09 DIAGNOSIS — R1033 Periumbilical pain: Secondary | ICD-10-CM | POA: Diagnosis not present

## 2020-10-12 ENCOUNTER — Other Ambulatory Visit: Payer: Self-pay | Admitting: Family Medicine

## 2020-10-12 DIAGNOSIS — R1033 Periumbilical pain: Secondary | ICD-10-CM

## 2020-10-30 ENCOUNTER — Other Ambulatory Visit (HOSPITAL_BASED_OUTPATIENT_CLINIC_OR_DEPARTMENT_OTHER): Payer: Self-pay | Admitting: Orthopedic Surgery

## 2020-10-30 ENCOUNTER — Other Ambulatory Visit: Payer: Self-pay | Admitting: Orthopedic Surgery

## 2020-10-30 DIAGNOSIS — G8929 Other chronic pain: Secondary | ICD-10-CM

## 2020-11-01 ENCOUNTER — Other Ambulatory Visit: Payer: Self-pay

## 2020-11-01 ENCOUNTER — Ambulatory Visit
Admission: RE | Admit: 2020-11-01 | Discharge: 2020-11-01 | Disposition: A | Discharge status: Home / Self Care | Payer: Medicare PPO | Source: Ambulatory Visit | Attending: Orthopedic Surgery | Admitting: Orthopedic Surgery

## 2020-11-01 DIAGNOSIS — M25561 Pain in right knee: Secondary | ICD-10-CM | POA: Insufficient documentation

## 2020-11-01 DIAGNOSIS — G8929 Other chronic pain: Secondary | ICD-10-CM | POA: Insufficient documentation

## 2020-11-05 ENCOUNTER — Other Ambulatory Visit: Payer: Self-pay | Admitting: Orthopedic Surgery

## 2020-11-09 ENCOUNTER — Encounter: Payer: Self-pay | Admitting: Orthopedic Surgery

## 2020-11-10 ENCOUNTER — Encounter
Admission: RE | Disposition: A | Discharge status: Home / Self Care | Payer: Self-pay | Source: Home / Self Care | Attending: Orthopedic Surgery

## 2020-11-10 ENCOUNTER — Ambulatory Visit: Payer: Medicare PPO | Attending: Orthopedic Surgery

## 2020-11-10 ENCOUNTER — Other Ambulatory Visit: Payer: Self-pay

## 2020-11-10 ENCOUNTER — Ambulatory Visit: Payer: Medicare PPO | Admitting: Anesthesiology

## 2020-11-10 ENCOUNTER — Ambulatory Visit
Admission: RE | Admit: 2020-11-10 | Discharge: 2020-11-10 | Disposition: A | Discharge status: Home / Self Care | Payer: Medicare PPO | Attending: Orthopedic Surgery | Admitting: Orthopedic Surgery

## 2020-11-10 ENCOUNTER — Other Ambulatory Visit: Payer: Self-pay | Admitting: Family Medicine

## 2020-11-10 DIAGNOSIS — X58XXXA Exposure to other specified factors, initial encounter: Secondary | ICD-10-CM | POA: Insufficient documentation

## 2020-11-10 DIAGNOSIS — S83281A Other tear of lateral meniscus, current injury, right knee, initial encounter: Secondary | ICD-10-CM | POA: Diagnosis present

## 2020-11-10 DIAGNOSIS — Z791 Long term (current) use of non-steroidal anti-inflammatories (NSAID): Secondary | ICD-10-CM | POA: Insufficient documentation

## 2020-11-10 DIAGNOSIS — M1711 Unilateral primary osteoarthritis, right knee: Secondary | ICD-10-CM | POA: Diagnosis not present

## 2020-11-10 DIAGNOSIS — Z886 Allergy status to analgesic agent status: Secondary | ICD-10-CM | POA: Insufficient documentation

## 2020-11-10 DIAGNOSIS — Z881 Allergy status to other antibiotic agents status: Secondary | ICD-10-CM | POA: Diagnosis not present

## 2020-11-10 DIAGNOSIS — M84352A Stress fracture, left femur, initial encounter for fracture: Secondary | ICD-10-CM | POA: Diagnosis not present

## 2020-11-10 DIAGNOSIS — Z1231 Encounter for screening mammogram for malignant neoplasm of breast: Secondary | ICD-10-CM

## 2020-11-10 DIAGNOSIS — Z91040 Latex allergy status: Secondary | ICD-10-CM | POA: Insufficient documentation

## 2020-11-10 DIAGNOSIS — Z91018 Allergy to other foods: Secondary | ICD-10-CM | POA: Insufficient documentation

## 2020-11-10 DIAGNOSIS — Z79899 Other long term (current) drug therapy: Secondary | ICD-10-CM | POA: Insufficient documentation

## 2020-11-10 DIAGNOSIS — M84361A Stress fracture, right tibia, initial encounter for fracture: Secondary | ICD-10-CM | POA: Insufficient documentation

## 2020-11-10 DIAGNOSIS — Z79890 Hormone replacement therapy (postmenopausal): Secondary | ICD-10-CM | POA: Diagnosis not present

## 2020-11-10 HISTORY — PX: KNEE ARTHROSCOPY WITH LATERAL RELEASE: SHX5649

## 2020-11-10 HISTORY — DX: Diverticulitis of intestine, part unspecified, without perforation or abscess without bleeding: K57.92

## 2020-11-10 SURGERY — ARTHROSCOPY, KNEE, WITH LATERAL RETINACULUM RELEASE
Anesthesia: General | Site: Knee | Laterality: Right

## 2020-11-10 MED ORDER — ACETAMINOPHEN 325 MG PO TABS: 325.0000 mg | ORAL_TABLET | ORAL | Status: DC | PRN

## 2020-11-10 MED ORDER — DEXAMETHASONE SODIUM PHOSPHATE 4 MG/ML IJ SOLN
INTRAMUSCULAR | Status: DC | PRN
  Administered 2020-11-10: 4 mg via INTRAVENOUS

## 2020-11-10 MED ORDER — MIDAZOLAM HCL 5 MG/5ML IJ SOLN
INTRAMUSCULAR | Status: DC | PRN
  Administered 2020-11-10: 1 mg via INTRAVENOUS

## 2020-11-10 MED ORDER — OXYCODONE HCL 5 MG PO TABS: 5.0000 mg | ORAL_TABLET | Freq: Once | ORAL | Status: AC | PRN

## 2020-11-10 MED ORDER — FENTANYL CITRATE PF 50 MCG/ML IJ SOSY
25.0000 ug | PREFILLED_SYRINGE | INTRAMUSCULAR | Status: DC | PRN
  Administered 2020-11-10 (×2): 25 ug via INTRAVENOUS

## 2020-11-10 MED ORDER — PROPOFOL 10 MG/ML IV BOLUS
INTRAVENOUS | Status: DC | PRN
Start: 2020-11-10 — End: 2020-11-10
  Administered 2020-11-10: 110 mg via INTRAVENOUS

## 2020-11-10 MED ORDER — LIDOCAINE-EPINEPHRINE 1 %-1:100000 IJ SOLN
INTRAMUSCULAR | Status: DC | PRN
  Administered 2020-11-10: 3 mL via INTRAMUSCULAR
  Administered 2020-11-10: 11 mL via INTRAMUSCULAR

## 2020-11-10 MED ORDER — GLYCOPYRROLATE 0.2 MG/ML IJ SOLN
INTRAMUSCULAR | Status: DC | PRN
  Administered 2020-11-10: .1 mg via INTRAVENOUS

## 2020-11-10 MED ORDER — ONDANSETRON HCL 4 MG/2ML IJ SOLN
INTRAMUSCULAR | Status: DC | PRN
  Administered 2020-11-10: 4 mg via INTRAVENOUS

## 2020-11-10 MED ORDER — CEFAZOLIN SODIUM-DEXTROSE 2-4 GM/100ML-% IV SOLN
2.0000 g | INTRAVENOUS | Status: AC
  Administered 2020-11-10: 2 g via INTRAVENOUS

## 2020-11-10 MED ORDER — ASPIRIN EC 325 MG PO TBEC: 325.0000 mg | DELAYED_RELEASE_TABLET | Freq: Every day | ORAL | 0 refills | Status: AC

## 2020-11-10 MED ORDER — IBUPROFEN 800 MG PO TABS: 800.0000 mg | ORAL_TABLET | Freq: Three times a day (TID) | ORAL | 1 refills | Status: AC

## 2020-11-10 MED ORDER — LACTATED RINGERS IV SOLN: INTRAVENOUS | Status: DC

## 2020-11-10 MED ORDER — ACETAMINOPHEN 160 MG/5ML PO SOLN: 325.0000 mg | ORAL | Status: DC | PRN

## 2020-11-10 MED ORDER — LIDOCAINE HCL (CARDIAC) PF 100 MG/5ML IV SOSY
PREFILLED_SYRINGE | INTRAVENOUS | Status: DC | PRN
  Administered 2020-11-10: 30 mg via INTRATRACHEAL

## 2020-11-10 MED ORDER — ONDANSETRON 4 MG PO TBDP
4.0000 mg | ORAL_TABLET | Freq: Three times a day (TID) | ORAL | 0 refills | Status: AC | PRN
Start: 2020-11-10 — End: ?

## 2020-11-10 MED ORDER — FENTANYL CITRATE (PF) 100 MCG/2ML IJ SOLN
INTRAMUSCULAR | Status: DC | PRN
  Administered 2020-11-10 (×2): 50 ug via INTRAVENOUS

## 2020-11-10 MED ORDER — ACETAMINOPHEN 500 MG PO TABS
1000.0000 mg | ORAL_TABLET | Freq: Once | ORAL | Status: AC
  Administered 2020-11-10: 1000 mg via ORAL

## 2020-11-10 MED ORDER — ACETAMINOPHEN 500 MG PO TABS: 1000.0000 mg | ORAL_TABLET | Freq: Three times a day (TID) | ORAL | 2 refills | Status: AC

## 2020-11-10 MED ORDER — HYDROCODONE-ACETAMINOPHEN 5-325 MG PO TABS: 1.0000 | ORAL_TABLET | ORAL | 0 refills | Status: DC | PRN

## 2020-11-10 MED ORDER — OXYCODONE HCL 5 MG/5ML PO SOLN
5.0000 mg | Freq: Once | ORAL | Status: AC | PRN
  Administered 2020-11-10: 5 mg via ORAL

## 2020-11-10 MED ORDER — LACTATED RINGERS IR SOLN
Status: DC | PRN
  Administered 2020-11-10: 12000 mL

## 2020-11-10 MED ORDER — ACETAMINOPHEN 160 MG/5ML PO SOLN: 960.0000 mg | Freq: Once | ORAL | Status: AC

## 2020-11-10 SURGICAL SUPPLY — 42 items
ADAPTER IRRIG TUBE 2 SPIKE SOL (ADAPTER) ×4 IMPLANT
ADPR TBG 2 SPK PMP STRL ASCP (ADAPTER) ×2
APL PRP STRL LF DISP 70% ISPRP (MISCELLANEOUS) ×1
BLADE SHAVER 4.5X7 STR FR (MISCELLANEOUS) ×2 IMPLANT
BLADE SHAVER AGGRES 5.5  STR (CUTTER) ×2
BLADE SHAVER AGGRES 5.5 STR (CUTTER) IMPLANT
BLADE SURG SZ11 CARB STEEL (BLADE) ×2 IMPLANT
BNDG COHESIVE 4X5 TAN ST LF (GAUZE/BANDAGES/DRESSINGS) ×2 IMPLANT
BNDG ESMARK 6X12 TAN STRL LF (GAUZE/BANDAGES/DRESSINGS) ×2 IMPLANT
CHLORAPREP W/TINT 26 (MISCELLANEOUS) ×2 IMPLANT
COOLER POLAR GLACIER W/PUMP (MISCELLANEOUS) ×2 IMPLANT
COVER LIGHT HANDLE UNIVERSAL (MISCELLANEOUS) ×4 IMPLANT
DRAPE IMP U-DRAPE 54X76 (DRAPES) ×2 IMPLANT
GAUZE SPONGE 4X4 12PLY STRL (GAUZE/BANDAGES/DRESSINGS) ×2 IMPLANT
GLOVE SURG POLYISO LF SZ6.5 (GLOVE) ×2 IMPLANT
GLOVE SURG POLYISO LF SZ7.5 (GLOVE) ×4 IMPLANT
GOWN STRL REUS W/ TWL LRG LVL3 (GOWN DISPOSABLE) ×1 IMPLANT
GOWN STRL REUS W/TWL LRG LVL3 (GOWN DISPOSABLE) ×4
GRAFT FILLER BONE 5ML (Knees) IMPLANT
IV LACTATED RINGER IRRG 3000ML (IV SOLUTION) ×8
IV LR IRRIG 3000ML ARTHROMATIC (IV SOLUTION) ×4 IMPLANT
KIT ACCUFILL 5CC (Knees) ×2 IMPLANT
KIT KNEE SCP 414.502 (Knees) ×4 IMPLANT
KIT TURNOVER KIT A (KITS) ×2 IMPLANT
MANIFOLD NEPTUNE II (INSTRUMENTS) ×2 IMPLANT
MAT ABSORB  FLUID 56X50 GRAY (MISCELLANEOUS) ×1
MAT ABSORB FLUID 56X50 GRAY (MISCELLANEOUS) ×2 IMPLANT
PACK ARTHROSCOPY KNEE (MISCELLANEOUS) ×2 IMPLANT
PAD ABD DERMACEA PRESS 5X9 (GAUZE/BANDAGES/DRESSINGS) ×3 IMPLANT
PAD WRAPON POLAR KNEE (MISCELLANEOUS) ×1 IMPLANT
PADDING CAST BLEND 6X4 STRL (MISCELLANEOUS) ×1 IMPLANT
PADDING STRL CAST 6IN (MISCELLANEOUS) ×1
SUCTION FRAZIER HANDLE 10FR (MISCELLANEOUS) ×2
SUCTION TUBE FRAZIER 10FR DISP (MISCELLANEOUS) IMPLANT
SUT ETHILON 3-0 FS-10 30 BLK (SUTURE) ×2
SUT VIC AB 0 CT1 36 (SUTURE) ×1 IMPLANT
SUT VIC AB 2-0 CT2 27 (SUTURE) ×1 IMPLANT
SUTURE EHLN 3-0 FS-10 30 BLK (SUTURE) IMPLANT
TUBING INFLOW SET DBFLO PUMP (TUBING) ×2 IMPLANT
TUBING OUTFLOW SET DBLFO PUMP (TUBING) ×2 IMPLANT
WAND WEREWOLF FLOW 90D (MISCELLANEOUS) ×2 IMPLANT
WRAPON POLAR PAD KNEE (MISCELLANEOUS) ×2

## 2020-11-10 NOTE — H&P (Signed)
Paper H&P to be scanned into permanent record. H&P reviewed. No significant changes noted.  

## 2020-11-10 NOTE — Anesthesia Procedure Notes (Signed)
Procedure Name: LMA Insertion Date/Time: 11/10/2020 1:29 PM Performed by: Mayme Genta, CRNA Pre-anesthesia Checklist: Patient identified, Emergency Drugs available, Suction available, Timeout performed and Patient being monitored Patient Re-evaluated:Patient Re-evaluated prior to induction Oxygen Delivery Method: Circle system utilized Preoxygenation: Pre-oxygenation with 100% oxygen Induction Type: IV induction LMA: LMA inserted LMA Size: 4.0 Number of attempts: 1 Placement Confirmation: positive ETCO2 and breath sounds checked- equal and bilateral Tube secured with: Tape

## 2020-11-10 NOTE — Anesthesia Postprocedure Evaluation (Signed)
Anesthesia Post Note  Patient: Sara Murphy  Procedure(s) Performed: Right knee arthroscopy, partial lateral meniscectomy and chondroplasty with subchondroplasty to the lateral femoral condyle and lateral tibial plateau (Right: Knee)     Patient location during evaluation: PACU Anesthesia Type: General Level of consciousness: awake and alert Pain management: pain level controlled Vital Signs Assessment: post-procedure vital signs reviewed and stable Respiratory status: spontaneous breathing, nonlabored ventilation, respiratory function stable and patient connected to nasal cannula oxygen Cardiovascular status: blood pressure returned to baseline and stable Postop Assessment: no apparent nausea or vomiting Anesthetic complications: no   No notable events documented.  Wanda Plump Davyn Elsasser

## 2020-11-10 NOTE — Anesthesia Preprocedure Evaluation (Signed)
Anesthesia Evaluation  Patient identified by MRN, date of birth, ID band Patient awake    Reviewed: Allergy & Precautions, NPO status   Airway Mallampati: II  TM Distance: >3 FB     Dental   Pulmonary    Pulmonary exam normal        Cardiovascular hypertension,  Rhythm:Regular Rate:Normal     Neuro/Psych Anxiety    GI/Hepatic   Endo/Other  Hypothyroidism   Renal/GU      Musculoskeletal  (+) Arthritis ,   Abdominal   Peds  Hematology  (+) anemia ,   Anesthesia Other Findings   Reproductive/Obstetrics                             Anesthesia Physical  Anesthesia Plan  ASA: 2  Anesthesia Plan: General   Post-op Pain Management:    Induction: Intravenous  PONV Risk Score and Plan: Treatment may vary due to age or medical condition  Airway Management Planned: LMA  Additional Equipment:   Intra-op Plan:   Post-operative Plan:   Informed Consent: I have reviewed the patients History and Physical, chart, labs and discussed the procedure including the risks, benefits and alternatives for the proposed anesthesia with the patient or authorized representative who has indicated his/her understanding and acceptance.     Dental advisory given  Plan Discussed with: CRNA, Anesthesiologist and Surgeon  Anesthesia Plan Comments:         Anesthesia Quick Evaluation

## 2020-11-10 NOTE — Transfer of Care (Signed)
Immediate Anesthesia Transfer of Care Note  Patient: Sara Murphy  Procedure(s) Performed: Right knee arthroscopy, partial lateral meniscectomy and chondroplasty with subchondroplasty to the lateral femoral condyle and lateral tibial plateau (Right: Knee)  Patient Location: PACU  Anesthesia Type: General  Level of Consciousness: awake, alert  and patient cooperative  Airway and Oxygen Therapy: Patient Spontanous Breathing and Patient connected to supplemental oxygen  Post-op Assessment: Post-op Vital signs reviewed, Patient's Cardiovascular Status Stable, Respiratory Function Stable, Patent Airway and No signs of Nausea or vomiting  Post-op Vital Signs: Reviewed and stable  Complications: No notable events documented.

## 2020-11-10 NOTE — Discharge Instructions (Signed)
Arthroscopic Knee Surgery - Partial Meniscectomy/Subchondroplasty   Post-Op Instructions   1. Bracing or crutches: Crutches will be provided at the time of discharge from the surgery center if you do not already have them.   2. Ice: You may be provided with a device [facility]) that allows you to ice the affected area effectively. Otherwise you can ice manually.    3. Driving:  Plan on not driving for at least two weeks. Please note that you are advised NOT to drive while taking narcotic pain medications as you may be impaired and unsafe to drive.   4. Activity: Ankle pumps several times an hour while awake to prevent blood clots. Weight bearing: as tolerated. Use crutches for as needed (usually ~1 week or less) until pain allows you to ambulate without a limp. Bending and straightening the knee is unlimited. Elevate knee above heart level as much as possible for one week. Avoid standing more than 5 minutes (consecutively) for the first week.  Avoid long distance travel for 2 weeks.  5. Medications:  - You have been provided a prescription for narcotic pain medicine. After surgery, take 1-2 narcotic tablets every 4 hours if needed for severe pain.  - You may take up to 3000mg /day of tylenol (acetaminophen). You can take 1000mg  3x/day. Please check your narcotic. If you have acetaminophen in your narcotic (each tablet will be 325mg ), be careful not to exceed a total of 3000mg /day of acetaminophen.  - A prescription for anti-nausea medication will be provided in case the narcotic medicine or anesthesia causes nausea - take 1 tablet every 6 hours only if nauseated.  - Take ibuprofen 800 mg every 8 hours WITH food to reduce post-operative knee swelling. DO NOT STOP IBUPROFEN POST-OP UNTIL INSTRUCTED TO DO SO at first post-op office visit (10-14 days after surgery). However, please discontinue if you have any abdominal discomfort after taking this.  - Take enteric coated aspirin 325 mg once daily for 2  weeks to prevent blood clots.    6. Bandages: The physical therapist should change the bandages at the first post-op appointment. If needed, the dressing supplies have been provided to you.   7. Physical Therapy: 1-2 times per week for 6 weeks. Therapy typically starts on post operative Day 3 or 4. You have been provided an order for physical therapy. The therapist will provide home exercises.   8. Work: May return to full work usually around 2 weeks after 1st post-operative visit. May do light duty/desk job in approximately 1-2 weeks when off of narcotics, pain is well-controlled, and swelling has decreased. Labor intensive jobs may require 4-6 weeks to return.      9. Post-Op Appointments: Your first post-op appointment will be with Dr. Posey Pronto in approximately 2 weeks time.    If you find that they have not been scheduled please call the Orthopaedic Appointment front desk at [phone].

## 2020-11-10 NOTE — Op Note (Addendum)
Operative Note    SURGERY DATE: 11/10/2020   PRE-OP DIAGNOSIS:  1.  Right subchondral insufficiency fracture of lateral tibial plateau 2.  Right subchondral insufficiency fracture of lateral femoral condyle 3.  Right lateral meniscus tear  4.  Right tricompartmental degenerative changes   POST-OP DIAGNOSIS:  1.  Right subchondral insufficiency fracture of lateral tibial plateau 2.  Right subchondral insufficiency fracture of lateral femoral condyle 3.  Right lateral meniscus tear  4.  Right tricompartmental degenerative changes   PROCEDURES:  1. Right knee arthroscopically assisted subchondroplasty of lateral tibial plateau and lateral femoral condyle (treatment of medial tibial plateau fracture and medial femoral condyle fracture) 2. Right knee arthroscopy, partial lateral meniscectomy 3. Right knee chondroplasty of the lateral compartment   SURGEON: Cato Mulligan, MD  ASSISTANT: Prescilla Sours, PA-S    ANESTHESIA: Regional + Gen   ESTIMATED BLOOD LOSS: minimal   TOTAL IV FLUIDS: per anesthesia   INDICATION(S):  Sara Murphy is a 75 y.o. female who initially underwent right knee partial lateral meniscectomy on 08/18/2020.  Postoperatively, the patient had continued pain that did not respond to corticosteroid injection.  Imaging was consistent with severe bone marrow edema and subchondral insufficiency fractures of the lateral tibial plateau and lateral femoral condyle in the setting of significant degenerative changes of the lateral compartment.  Additionally MRI showed a recurrent lateral meniscus tear.  We discussed that given her degenerative changes, a knee arthroplasty procedure may be the most predictable surgical option, but the patient did not wish to proceed in this fashion due to associated rehab and her current desired activity level.  Therefore, after discussion of risks, benefits, and alternatives to surgery, the patient elected to proceed with above procedures.   The patient understands that he may still end up needing an arthroplasty type procedure in the future.   OPERATIVE FINDINGS:    Examination under anesthesia: A careful examination under anesthesia was performed.  Passive range of motion was: Hyperextension: 1.  Extension: 0.  Flexion: 130.  Lachman: normal. Pivot Shift: normal.  Posterior drawer: normal.  Varus stability in full extension: normal.  Varus stability in 30 degrees of flexion: normal.  Valgus stability in full extension: normal.  Valgus stability in 30 degrees of flexion: normal.   Intra-operative findings: A thorough arthroscopic examination of the knee was performed.  The findings are: 1. Suprapatellar pouch: Normal 2. Undersurface of median ridge: Grade 2 degenerative changes 3. Medial patellar facet: Grade 1 softening 4. Lateral patellar facet: Grade 2 degenerative changes 5. Trochlea: Grade 1-2 degenerative changes centrally 6. Lateral gutter/popliteus tendon: Normal 7. Hoffa's fat pad: Normal 8. Medial gutter/plica: Normal 9. ACL: Normal 10. PCL: Normal 11. Medial meniscus: Normal 12. Medial compartment cartilage: Grade 1-2 degenerative changes to the medial femoral condyle and tibial plateau 13. Lateral meniscus: Previous partial meniscectomy involving full width radial tear at the anterior horn/body junction noted.  Mild horizontal tearing at the meniscus body/posterior horn junction. 14. Lateral compartment cartilage: Extensive grade 4 degenerative changes to the femoral condyle and tibial plateau   OPERATIVE REPORT:     I identified Sara Murphy in the pre-operative holding area. I marked the operative knee with my initials. I reviewed the risks and benefits of the proposed surgical intervention and the patient (and/or patient's guardian) wished to proceed.  The patient underwent regional anesthesia in the preoperative holding area.  The patient was transferred to the operative suite and placed in the supine  position with all bony  prominences padded.  Anesthesia was administered. Appropriate IV antibiotics were administered prior to incision. The extremity was then prepped and draped in standard fashion. A time out was performed confirming the correct extremity, correct patient, and correct procedure.    Using fluoroscopic guidance and correlation with the patient's MRI, a small stab incision was made over the lateral femoral condyle.  A trocar with cannula was drilled into the site of the subchondral insufficiency fracture on the lateral femoral condyle such that all of the flutes were within bone.  This was similarly repeated for the lateral tibial plateau subchondral insufficiency fracture.  Calcium phosphate mixture was appropriately mixed and 5 cc of calcium phosphate were injected through the cannula into the lateral femoral condyle subchondral insufficiency fracture.  Appropriate filling was seen on fluoroscopy.  This was then repeated for the lateral tibial plateau subchondral insufficiency fracture.  There was some mild extravasation of calcium phosphate on the femoral side.  The arthroscope was placed back into the joint and an oscillating shaver was run until the calcium phosphate settled such that there was no significant residue of calcium phosphate within the joint.    Next, the arthroscopic portion of the procedure was performed.  Previously utilized arthroscopy portals were marked. Local anesthetic was injected to the planned portal sites. The anterolateral portal was established with an 11 blade. The arthroscope was placed in the anterolateral portal and then into the suprapatellar pouch.  A diagnostic knee scope was completed with the above findings. The lateral meniscus tear was identified.   Next the medial portal was established under needle localization.  The meniscal tear was debrided using an arthroscopic biter and an oscillating shaver until the meniscus had stable borders. A chondroplasty  was performed of the lateral compartment and patellofemoral compartment such that there were stable cartilage edges without any loose fragments of cartilage.    The incisions were closed with 3-0 Nylon suture. Sterile dressings included Xeroform, 4x4s, Sof-Rol, and Bias wrap. A Polarcare was placed.  The patient was then awakened and taken to the PACU hemodynamically stable without complication.     POSTOPERATIVE PLAN: The patient will be discharged home today once they meet PACU criteria. Aspirin 325 mg daily was prescribed for 2 weeks for DVT prophylaxis.  Physical therapy will start on POD#3-4. Weight-bearing as tolerated. Follow up in 2 weeks per protocol.

## 2020-11-11 ENCOUNTER — Encounter: Payer: Self-pay | Admitting: Orthopedic Surgery

## 2020-11-12 ENCOUNTER — Other Ambulatory Visit: Payer: Self-pay | Admitting: Orthopedic Surgery

## 2020-11-12 ENCOUNTER — Other Ambulatory Visit (HOSPITAL_COMMUNITY): Payer: Self-pay | Admitting: Orthopedic Surgery

## 2020-11-12 ENCOUNTER — Ambulatory Visit
Admission: RE | Admit: 2020-11-12 | Discharge: 2020-11-12 | Disposition: A | Discharge status: Home / Self Care | Payer: Medicare PPO | Source: Ambulatory Visit | Attending: Orthopedic Surgery | Admitting: Orthopedic Surgery

## 2020-11-12 ENCOUNTER — Other Ambulatory Visit: Payer: Self-pay

## 2020-11-12 DIAGNOSIS — M7989 Other specified soft tissue disorders: Secondary | ICD-10-CM | POA: Diagnosis present

## 2021-03-09 ENCOUNTER — Ambulatory Visit: Payer: Medicare PPO

## 2021-03-16 ENCOUNTER — Other Ambulatory Visit: Payer: Self-pay | Admitting: Family Medicine

## 2021-03-16 DIAGNOSIS — Z780 Asymptomatic menopausal state: Secondary | ICD-10-CM

## 2021-03-26 ENCOUNTER — Other Ambulatory Visit: Payer: Self-pay | Admitting: Family Medicine

## 2021-03-26 ENCOUNTER — Other Ambulatory Visit: Payer: Self-pay

## 2021-03-26 ENCOUNTER — Ambulatory Visit
Admission: RE | Admit: 2021-03-26 | Discharge: 2021-03-26 | Disposition: A | Discharge status: Home / Self Care | Payer: Medicare PPO | Source: Ambulatory Visit | Attending: Family Medicine | Admitting: Family Medicine

## 2021-03-26 DIAGNOSIS — Z1231 Encounter for screening mammogram for malignant neoplasm of breast: Secondary | ICD-10-CM | POA: Diagnosis not present

## 2021-03-30 ENCOUNTER — Other Ambulatory Visit: Payer: Self-pay | Admitting: Family Medicine

## 2021-03-30 DIAGNOSIS — R928 Other abnormal and inconclusive findings on diagnostic imaging of breast: Secondary | ICD-10-CM

## 2021-03-30 DIAGNOSIS — N63 Unspecified lump in unspecified breast: Secondary | ICD-10-CM

## 2021-03-30 DIAGNOSIS — R921 Mammographic calcification found on diagnostic imaging of breast: Secondary | ICD-10-CM

## 2021-04-01 ENCOUNTER — Other Ambulatory Visit: Payer: Self-pay

## 2021-04-01 ENCOUNTER — Ambulatory Visit
Admission: RE | Admit: 2021-04-01 | Discharge: 2021-04-01 | Disposition: A | Discharge status: Home / Self Care | Payer: Medicare PPO | Source: Ambulatory Visit | Attending: Family Medicine | Admitting: Family Medicine

## 2021-04-01 DIAGNOSIS — R928 Other abnormal and inconclusive findings on diagnostic imaging of breast: Secondary | ICD-10-CM | POA: Diagnosis present

## 2021-04-01 DIAGNOSIS — N63 Unspecified lump in unspecified breast: Secondary | ICD-10-CM | POA: Diagnosis present

## 2021-04-01 DIAGNOSIS — R921 Mammographic calcification found on diagnostic imaging of breast: Secondary | ICD-10-CM

## 2021-04-02 ENCOUNTER — Other Ambulatory Visit: Payer: Self-pay | Admitting: Family Medicine

## 2021-04-05 ENCOUNTER — Other Ambulatory Visit: Payer: Self-pay | Admitting: Family Medicine

## 2021-04-05 DIAGNOSIS — R921 Mammographic calcification found on diagnostic imaging of breast: Secondary | ICD-10-CM

## 2021-04-16 ENCOUNTER — Other Ambulatory Visit: Payer: Medicare PPO

## 2021-04-20 ENCOUNTER — Other Ambulatory Visit: Payer: Self-pay

## 2021-04-20 ENCOUNTER — Ambulatory Visit
Admission: RE | Admit: 2021-04-20 | Discharge: 2021-04-20 | Disposition: A | Discharge status: Home / Self Care | Payer: Medicare PPO | Source: Ambulatory Visit | Attending: Family Medicine | Admitting: Family Medicine

## 2021-04-20 DIAGNOSIS — Z780 Asymptomatic menopausal state: Secondary | ICD-10-CM | POA: Insufficient documentation

## 2021-10-05 ENCOUNTER — Ambulatory Visit
Admission: RE | Admit: 2021-10-05 | Discharge: 2021-10-05 | Disposition: A | Discharge status: Home / Self Care | Payer: Medicare PPO | Source: Ambulatory Visit | Attending: Family Medicine | Admitting: Family Medicine

## 2021-10-05 DIAGNOSIS — R921 Mammographic calcification found on diagnostic imaging of breast: Secondary | ICD-10-CM | POA: Diagnosis not present

## 2022-03-08 ENCOUNTER — Ambulatory Visit (INDEPENDENT_AMBULATORY_CARE_PROVIDER_SITE_OTHER): Payer: Medicare PPO

## 2022-03-08 ENCOUNTER — Encounter: Payer: Self-pay | Admitting: Emergency Medicine

## 2022-03-08 ENCOUNTER — Ambulatory Visit
Admission: EM | Admit: 2022-03-08 | Discharge: 2022-03-08 | Disposition: A | Discharge status: Home / Self Care | Payer: Medicare PPO | Attending: Emergency Medicine | Admitting: Emergency Medicine

## 2022-03-08 ENCOUNTER — Other Ambulatory Visit: Payer: Self-pay

## 2022-03-08 DIAGNOSIS — J069 Acute upper respiratory infection, unspecified: Secondary | ICD-10-CM

## 2022-03-08 MED ORDER — PROMETHAZINE-DM 6.25-15 MG/5ML PO SYRP: 2.5000 mL | ORAL_SOLUTION | Freq: Four times a day (QID) | ORAL | 0 refills | Status: DC | PRN

## 2022-03-08 MED ORDER — BENZONATATE 100 MG PO CAPS: 100.0000 mg | ORAL_CAPSULE | Freq: Three times a day (TID) | ORAL | 0 refills | Status: DC

## 2022-03-08 MED ORDER — PREDNISONE 20 MG PO TABS: 40.0000 mg | ORAL_TABLET | Freq: Every day | ORAL | 0 refills | Status: DC

## 2022-03-08 MED ORDER — AMOXICILLIN-POT CLAVULANATE 875-125 MG PO TABS: 1.0000 | ORAL_TABLET | Freq: Two times a day (BID) | ORAL | 0 refills | Status: DC

## 2022-03-08 NOTE — ED Provider Notes (Signed)
[facility]    CSN: [id number] Arrival date & time: 03/08/22  0932      History   Chief Complaint Chief Complaint  Patient presents with   Cough    HPI Sara Murphy is a 77 y.o. female.   Patient presents for evaluation of fever, nasal congestion, rhinorrhea, sore throat, bilateral ear pain and a cough present for 2 days.  Cough is nonproductive but she feels mucus is caught in the throat which causes her to gag.  Fevers are considered to be low-grade.  Decreased appetite but tolerating some food and fluids.  No known sick contacts prior.  Denies shortness of breath or wheezing.  Has attempted use of ibuprofen.    Past Medical History:  Diagnosis Date   Anemia    slightly   Arthritis    Cancer ([place])    basal cell and melanoma   Diverticulitis    Hypertension     Patient Active Problem List   Diagnosis Date Noted   Internal derangement of right knee 08/10/2017   Primary osteoarthritis of right knee 08/10/2017   Essential hypertension 07/10/2017   Complex tear of medial meniscus of right knee as current injury 08/26/2015   Situational anxiety 05/07/2015   Diverticulitis of sigmoid colon 11/26/2014   Facet arthritis of lumbar region 12/31/2013   SI joint arthritis 12/10/2013   Spinal stenosis of lumbar region with radiculopathy 12/10/2013   Arthralgia of right hip 12/02/2013   Spondylosis of lumbar region without myelopathy or radiculopathy 12/02/2013   Hypercholesterolemia 11/26/2013   Abnormal ECG 05/31/2013   History of melanoma 05/17/2012   Acquired hypothyroidism 09/13/2010   Insomnia 09/13/2010    Past Surgical History:  Procedure Laterality Date   APPENDECTOMY     BREAST BIOPSY Left 1980's &06/18/97   neg   EYE SURGERY     KNEE ARTHROSCOPY WITH LATERAL RELEASE Right 11/10/2020   Procedure: Right knee arthroscopy, partial lateral meniscectomy and chondroplasty with subchondroplasty to the lateral femoral condyle and lateral tibial  plateau;  Surgeon: Leim Fabry, MD;  Location: [place];  Service: Orthopedics;  Laterality: Right;  Latex   KNEE ARTHROSCOPY WITH MEDIAL MENISECTOMY Right 08/18/2020   Procedure: Right knee arthroscopic partial lateral and/or medial meniscectomy with partial synovectomy;  Surgeon: Leim Fabry, MD;  Location: [place];  Service: Orthopedics;  Laterality: Right;    OB History   No obstetric history on file.      Home Medications    Prior to Admission medications   Medication Sig Start Date End Date Taking? Authorizing Provider  amLODipine (NORVASC) 5 MG tablet Take 2.5 mg by mouth daily. 10/23/19   [provider]  carboxymethylcellulose (REFRESH PLUS) 0.5 % SOLN Place 1-2 drops into both eyes 1 day or 1 dose.    [provider]  cetirizine (ZYRTEC) 10 MG tablet Take 10 mg by mouth at bedtime.    [provider]  doxepin (SINEQUAN) 10 MG capsule Take 1 capsule by mouth at bedtime as needed. Patient not taking: Reported on 03/08/2022 11/29/18 11/10/20  [provider]  escitalopram (LEXAPRO) 5 MG tablet Take 1 tablet by mouth daily. 01/24/19   [provider]  fluticasone (FLONASE) 50 MCG/ACT nasal spray Place 1 spray into both nostrils daily.    [provider]  HYDROcodone-acetaminophen (NORCO) 5-325 MG tablet Take 1-2 tablets by mouth every 4 (four) hours as needed for moderate pain or severe pain. Patient not taking: Reported on 03/08/2022 11/10/20  Leim Fabry, MD  levothyroxine (SYNTHROID, LEVOTHROID) 100 MCG tablet Take 100 mcg by mouth daily before breakfast.    [provider]  lisinopril (PRINIVIL,ZESTRIL) 2.5 MG tablet Take 20 mg by mouth. 06/28/17 11/10/20  [provider]  montelukast (SINGULAIR) 10 MG tablet  09/18/17   [provider]  omeprazole (PRILOSEC) 40 MG capsule Take 40 mg by mouth daily as needed. Patient not taking: Reported on 03/08/2022    [provider]   ondansetron (ZOFRAN ODT) 4 MG disintegrating tablet Take 1 tablet (4 mg total) by mouth every 8 (eight) hours as needed for nausea or vomiting. 11/10/20   Leim Fabry, MD    Family History Family History  Problem Relation Age of Onset   Breast cancer Cousin 75    Social History Social History   Tobacco Use   Smoking status: Never   Smokeless tobacco: Never  Vaping Use   Vaping Use: Never used  Substance Use Topics   Alcohol use: Yes   Drug use: Never     Allergies   Azithromycin, Nsaids, Erythromycin, Other, Doxycycline, and Latex   Review of Systems Review of Systems  Constitutional:  Positive for fever. Negative for activity change, appetite change, chills, diaphoresis, fatigue and unexpected weight change.  HENT:  Positive for congestion, ear pain, rhinorrhea and sore throat. Negative for dental problem, drooling, ear discharge, facial swelling, hearing loss, mouth sores, nosebleeds, postnasal drip, sinus pressure, sinus pain, sneezing, tinnitus, trouble swallowing and voice change.   Respiratory:  Positive for cough. Negative for apnea, choking, chest tightness, shortness of breath, wheezing and stridor.   Cardiovascular: Negative.   Gastrointestinal: Negative.   Skin: Negative.   Neurological: Negative.      Physical Exam Triage Vital Signs ED Triage Vitals  Enc Vitals Group     BP 03/08/22 1036 (!) 154/75     Pulse Rate 03/08/22 1036 79     Resp 03/08/22 1036 20     Temp 03/08/22 1036 99.7 F (37.6 C)     Temp Source 03/08/22 1036 Oral     SpO2 03/08/22 1036 100 %     Weight --      Height --      Head Circumference --      Peak Flow --      Pain Score 03/08/22 1033 4     Pain Loc --      Pain Edu? --      Excl. in [place]? --    No data found.  Updated Vital Signs BP (!) 154/75 (BP Location: Right Arm)   Pulse 79   Temp 99.7 F (37.6 C) (Oral)   Resp 20   SpO2 100%   Visual Acuity Right Eye Distance:   Left Eye Distance:   Bilateral  Distance:    Right Eye Near:   Left Eye Near:    Bilateral Near:     Physical Exam Constitutional:      Appearance: She is ill-appearing.  HENT:     Head: Normocephalic.     Right Ear: Tympanic membrane, ear canal and external ear normal.     Left Ear: Tympanic membrane, ear canal and external ear normal.     Nose: Congestion and rhinorrhea present.     Mouth/Throat:     Mouth: Mucous membranes are moist.     Pharynx: No posterior oropharyngeal erythema.  Eyes:     Extraocular Movements: Extraocular movements intact.  Cardiovascular:     Rate and Rhythm:  Normal rate and regular rhythm.     Pulses: Normal pulses.     Heart sounds: Normal heart sounds.  Pulmonary:     Effort: Pulmonary effort is normal.     Breath sounds: Normal breath sounds.     Comments: Persisting nonproductive cough witnessed in office Neurological:     Mental Status: She is alert and oriented to person, place, and time. Mental status is at baseline.      UC Treatments / Results  Labs (all labs ordered are listed, but only abnormal results are displayed) Labs Reviewed - No data to display  EKG   Radiology No results found.  Procedures Procedures (including critical care time)  Medications Ordered in UC Medications - No data to display  Initial Impression / Assessment and Plan / UC Course  I have reviewed the triage vital signs and the nursing notes.  Pertinent labs & imaging results that were available during my care of the patient were reviewed by me and considered in my medical decision making (see chart for details).  Acute upper respiratory infection  Vital signs are stable while ill-appearing patient is in no signs of distress nor toxic, chest x-ray is negative as symptoms have been present for 14 days without signs of resolution bacterial coverage will be given, prescribed Augmentin, prednisone, Tessalon and Promethazine DM as cough is most worrisome symptom, may use additional  over-the-counter medications as needed for supportive care with urgent care follow-up as needed Final Clinical Impressions(s) / UC Diagnoses   Final diagnoses:  None   Discharge Instructions   None    ED Prescriptions   None    PDMP not reviewed this encounter.   Hans Eden, NP 03/08/22 1154

## 2022-03-08 NOTE — Discharge Instructions (Addendum)
X-rays negative however she has had symptoms for 14 days without signs of improvement will be provided of an antibiotic  Begin Augmentin every morning and every evening for 7 days, daily will see improvement in about 48 hours and steady progression from there  Begin use of prednisone every morning with food for 5 days to relax the airway and help to minimize your cough  May use Tessalon pill every 8 hours to help calm your coughing  May use cough syrup every 6 hours as needed for additional comfort    You can take Tylenol and/or Ibuprofen as needed for fever reduction and pain relief.   For cough: honey 1/2 to 1 teaspoon (you can dilute the honey in water or another fluid).  You can also use guaifenesin and dextromethorphan for cough. You can use a humidifier for chest congestion and cough.  If you don't have a humidifier, you can sit in the bathroom with the hot shower running.      For sore throat: try warm salt water gargles, cepacol lozenges, throat spray, warm tea or water with lemon/honey, popsicles or ice, or OTC cold relief medicine for throat discomfort.   For congestion: take a daily anti-histamine like Zyrtec, Claritin, and a oral decongestant, such as pseudoephedrine.  You can also use Flonase 1-2 sprays in each nostril daily.   It is important to stay hydrated: drink plenty of fluids (water, gatorade/powerade/pedialyte, juices, or teas) to keep your throat moisturized and help further relieve irritation/discomfort.

## 2022-03-08 NOTE — ED Triage Notes (Signed)
Symptoms started 2 weeks ago.  Symptoms include cough, congestion, headache and sore throat.  Patient reports she has taken Singulair and tylenol.

## 2022-03-18 ENCOUNTER — Other Ambulatory Visit: Payer: Self-pay | Admitting: Family Medicine

## 2022-03-18 DIAGNOSIS — R928 Other abnormal and inconclusive findings on diagnostic imaging of breast: Secondary | ICD-10-CM

## 2022-04-01 ENCOUNTER — Ambulatory Visit
Admission: RE | Admit: 2022-04-01 | Discharge: 2022-04-01 | Disposition: A | Discharge status: Home / Self Care | Payer: Medicare PPO | Source: Ambulatory Visit | Attending: Family Medicine | Admitting: Family Medicine

## 2022-04-01 DIAGNOSIS — R928 Other abnormal and inconclusive findings on diagnostic imaging of breast: Secondary | ICD-10-CM

## 2022-09-05 NOTE — Progress Notes (Unsigned)
Celso Amy, PA-C [address]  [address]  [place], [place] [address]  Main: [phone]  Fax: [phone]   Primary Care Physician: Rolm Gala, MD  Primary Gastroenterologist:  Celso Amy, PA-C / Dr. Wyline Mood    CC: Follow-up sigmoid diverticulitis  HPI: Sara Murphy is a 77 y.o. female presents for hospital follow-up of sigmoid diverticulitis.  She has history of recurrent episodes of diverticulitis for many years (at least 2016).  She went to [facility] ED 08/28/2022 to evaluate lower abdominal pain, sigmoid diverticulitis, nausea, and dysuria.  Admitted to hospital 8//24 until 08/31/2022.  CT scan showed acute sigmoid diverticulitis.  No perforation or abscess.  Mild leukocytosis on CBC.  Treated with IV Zosyn and p.o. Augmentin (finished 2 days ago).  Was recommended that she have outpatient follow-up colonoscopy in 6 to 8 weeks.  Patient reports previous trauma from a previous colonoscopy.  Previous appendectomy.  History of lactose intolerance.  Has episodes of chronic diarrhea.  Current Symptoms: Patient finished Augmentin antibiotic 2 days ago.  LLQ pain has greatly improved.  She continues to have mid suprapubic discomfort and a generalized weak feeling.  She is having watery diarrhea 5 times per day.  Having some nausea but no vomiting.  Denies melena or hematochezia.  Has lost 5 pounds in the past month.  She has follow-up with her PCP tomorrow to follow-up with UTI.  She is taking Zofran as needed for nausea and Imodium as needed for diarrhea.  Has been on probiotic for 2 days.  She was treated for another episode of acute diverticulitis seen on CT 01/2020.  Treated with Augmentin.  Last seen on our office by Dr. Maximino Greenland 08/2019 to evaluate diarrhea.  [name] was continued on Metamucil and Imodium.  Given low FODMAP diet.  Celiac panel labs negative.  Stool studies through PCP negative.  Colonoscopy in 10/2014 at [facility] showed sigmoid diverticulosis, otherwise  normal.  Good prep.  No polyps.  Biopsies negative for microscopic colitis.   Current Outpatient Medications  Medication Sig Dispense Refill   carboxymethylcellulose (REFRESH PLUS) 0.5 % SOLN Place 1-2 drops into both eyes 1 day or 1 dose.     escitalopram (LEXAPRO) 5 MG tablet Take 1 tablet by mouth daily.     fluticasone (FLONASE) 50 MCG/ACT nasal spray Place 1 spray into both nostrils daily.     levothyroxine (SYNTHROID, LEVOTHROID) 100 MCG tablet Take 100 mcg by mouth daily before breakfast.     montelukast (SINGULAIR) 10 MG tablet      ondansetron (ZOFRAN ODT) 4 MG disintegrating tablet Take 1 tablet (4 mg total) by mouth every 8 (eight) hours as needed for nausea or vomiting. 20 tablet 0   lisinopril (PRINIVIL,ZESTRIL) 2.5 MG tablet Take 20 mg by mouth.     No current facility-administered medications for this visit.    Allergies as of 09/06/2022 - Review Complete 09/06/2022  Allergen Reaction Noted   Azithromycin Other (See Comments) 09/13/2010   Nsaids Other (See Comments) 08/10/2017   Erythromycin Nausea And Vomiting 07/12/2015   Other Diarrhea 11/23/2012   Doxycycline Rash 07/12/2015   Latex Rash 09/20/2019    Past Medical History:  Diagnosis Date   Anemia    slightly   Arthritis    Cancer (HCC)    basal cell and melanoma   Diverticulitis    Hypertension     Past Surgical History:  Procedure Laterality Date   APPENDECTOMY     BREAST BIOPSY Left 1980's &  06/18/97   neg   EYE SURGERY     KNEE ARTHROSCOPY WITH LATERAL RELEASE Right 11/10/2020   Procedure: Right knee arthroscopy, partial lateral meniscectomy and chondroplasty with subchondroplasty to the lateral femoral condyle and lateral tibial plateau;  Surgeon: Signa Kell, MD;  Location: [facility];  Service: Orthopedics;  Laterality: Right;  Latex   KNEE ARTHROSCOPY WITH MEDIAL MENISECTOMY Right 08/18/2020   Procedure: Right knee arthroscopic partial lateral and/or medial meniscectomy with partial  synovectomy;  Surgeon: Signa Kell, MD;  Location: [facility];  Service: Orthopedics;  Laterality: Right;    Review of Systems:    All systems reviewed and negative except where noted in HPI.   Physical Examination:   BP 127/69   Pulse 71   Temp 97.9 F (36.6 C)   Ht 5\' 3"  (1.6 m)   Wt 115 lb (52.2 kg)   BMI 20.37 kg/m   General: Well-nourished, well-developed, thin, in no acute distress.  She is able to get on and off exam table with no help. Lungs: Clear to auscultation bilaterally. Non-labored. Heart: Regular rate and rhythm, no murmurs rubs or gallops.  Abdomen: Bowel sounds are normal; Abdomen is Soft; No hepatosplenomegaly, masses or hernias; there is mild LLQ and suprapubic tenderness; rest of abdomen is not tender.  No upper abdominal tenderness.  No guarding or rebound tenderness. Neuro: Alert and oriented x 3.  Grossly intact.  No assistive walking devices. Psych: Alert and cooperative, normal mood and affect.   Imaging Studies: No results found.  Assessment and Plan:   Sara Murphy is a 77 y.o. y/o female follow-up of acute uncomplicated sigmoid diverticulitis diagnosed on CT done 08/28/2022.  No perforation or abscess.  Hospitalized at [facility] 8/4 until 8/7.  Treated with IV Zosyn and oral Augmentin.  Was recommended that she have follow-up colonoscopy 6 to 8 weeks after treatment.  She is currently having a lot of watery diarrhea and persistent mild suprapubic pain.  Recently treated for UTI.  Has follow-up with PCP tomorrow for UTI.  I am concerned about C. difficile or other infectious diarrhea given her recent antibiotic use.  She feels weak today, although she looks good.  I am checking lab work and stool studies for further evaluation.  Pending these results, then we can decide about scheduling repeat colonoscopy.  1.  Uncomplicated sigmoid diverticulitis with no perforation or abscess; currently improved.  Recommend soft bland diet.  If she has  recurrent severe LLQ pain, then repeat CT scan.  2.  Diarrhea; rule out C. difficile or other infectious pathogen  Stool studies: GI pathogen panel, C. difficile toxin PCR, fecal calprotectin, fecal elastase.  3.  Weakness  CBC, CMP, TSH  4.  Lower abdominal pain (suprapubic); recently treated for UTI  She has follow-up appointment with her PCP tomorrow to recheck urinalysis.  Celso Amy, PA-C  Follow up in 2 weeks with TG.  If stool studies and labs are unrevealing, then we will proceed with scheduling a colonoscopy.

## 2022-09-06 ENCOUNTER — Ambulatory Visit: Payer: Medicare PPO | Admitting: Physician Assistant

## 2022-09-06 ENCOUNTER — Encounter: Payer: Self-pay | Admitting: Physician Assistant

## 2022-09-06 VITALS — BP 127/69 | HR 71 | Temp 97.9°F | Ht 63.0 in | Wt 115.0 lb

## 2022-09-06 DIAGNOSIS — R102 Pelvic and perineal pain: Secondary | ICD-10-CM | POA: Diagnosis not present

## 2022-09-06 DIAGNOSIS — R531 Weakness: Secondary | ICD-10-CM | POA: Diagnosis not present

## 2022-09-06 DIAGNOSIS — R197 Diarrhea, unspecified: Secondary | ICD-10-CM | POA: Diagnosis not present

## 2022-09-06 DIAGNOSIS — Z8719 Personal history of other diseases of the digestive system: Secondary | ICD-10-CM | POA: Diagnosis not present

## 2022-09-07 ENCOUNTER — Telehealth: Payer: Self-pay

## 2022-09-07 NOTE — Telephone Encounter (Signed)
Patient notified of all results- she wanted to let Inetta Fermo know that she still has UTI and was prescribed an different antibiotic.  Call and notify patient CBC, CMP, and TSH labs are all normal.  White count, hemoglobin, glucose, kidney test, electrolytes, liver test, and thyroid test are all normal.  No evidence of infection, anemia, or dehydration.  Continue with current plan to do stool studies and keep follow-up office visit.

## 2022-09-07 NOTE — Progress Notes (Signed)
Call and notify patient CBC, CMP, and TSH labs are all normal.  White count, hemoglobin, glucose, kidney test, electrolytes, liver test, and thyroid test are all normal.  No evidence of infection, anemia, or dehydration.  Continue with current plan to do stool studies and keep follow-up office visit.

## 2022-09-13 LAB — GI PROFILE, STOOL, PCR

## 2022-09-13 LAB — CALPROTECTIN, FECAL: Calprotectin, Fecal: 10 ug/g (ref 0–120)

## 2022-09-13 LAB — PANCREATIC ELASTASE, FECAL: Pancreatic Elastase, Fecal: >800 ug Elast./g (ref >200)

## 2022-09-15 ENCOUNTER — Other Ambulatory Visit: Payer: Self-pay

## 2022-09-19 NOTE — Progress Notes (Signed)
Call and notify patient stool test are negative for all infections.  C. difficile and GI pathogen panel showed no infection to cause diarrhea.  Fecal pancreatic elastase test is normal.  Fecal calprotectin stool test is normal.  No evidence of pancreatic insufficiency or inflammatory bowel disease.  Continue with current plan.  Okay to take OTC Imodium as needed for diarrhea.

## 2022-09-28 ENCOUNTER — Ambulatory Visit: Payer: Medicare PPO | Admitting: Physician Assistant

## 2022-09-28 ENCOUNTER — Encounter: Payer: Self-pay | Admitting: Physician Assistant

## 2022-09-28 VITALS — BP 167/72 | HR 67 | Temp 98.3°F | Ht 63.0 in | Wt 117.4 lb

## 2022-09-28 DIAGNOSIS — R197 Diarrhea, unspecified: Secondary | ICD-10-CM | POA: Diagnosis not present

## 2022-09-28 DIAGNOSIS — Z8719 Personal history of other diseases of the digestive system: Secondary | ICD-10-CM

## 2022-09-28 MED ORDER — NA SULFATE-K SULFATE-MG SULF 17.5-3.13-1.6 GM/177ML PO SOLN
1.0000 | Freq: Once | ORAL | 0 refills | Status: AC
Start: 2022-09-28 — End: 2022-09-28

## 2022-09-28 NOTE — Progress Notes (Signed)
Celso Amy, PA-C [address]  [address]  [place], [place] [address]  Main: [phone]  Fax: [phone]   Primary Care Physician: Rolm Gala, MD  Primary Gastroenterologist:  Celso Amy, PA-C / Dr. Wyline Mood    CC:  F/U Diarrhea  HPI: Sara Murphy is a 77 y.o. female returns for 2 week followup of chronic diarrhea and uncomplicated sigmoid diverticulitis.  Recent GI pathogen panel and C. difficile toxin PCR stool test were negative for infections.  Fecal elastase and fecal calprotectin were normal.  Currently she has no more LLQ abdominal pain.  She continues to have chronic diarrhea.  Currently taking 1 or 2 Imodium daily which helps.  Denies rectal bleeding or weight loss.  Admitted to hospital 08/28/22 until 08/31/2022.  CT scan showed acute sigmoid diverticulitis.  No perforation or abscess.  Treated with IV Zosyn and p.o. Augmentin (finished 09/04/22).  Was recommended that she have outpatient follow-up colonoscopy in 6 to 8 weeks.  Patient reports previous trauma from a previous colonoscopy.  Previous appendectomy.  History of lactose intolerance.  Has episodes of chronic diarrhea.   She was treated for another episode of acute diverticulitis seen on CT 01/2020.  Treated with Augmentin.   Last seen on our office by Dr. Maximino Greenland 08/2019 to evaluate diarrhea.  Treated with Metamucil and Imodium.  Given low FODMAP diet.  Celiac panel labs negative.     Colonoscopy in 10/2014 at [facility] showed sigmoid diverticulosis, otherwise normal.  Good prep.  No polyps.  Biopsies negative for microscopic colitis.  Current Outpatient Medications  Medication Sig Dispense Refill   acetaminophen (TYLENOL) 325 MG tablet Take by mouth.     amLODipine (NORVASC) 10 MG tablet Take by mouth.     carboxymethylcellulose (REFRESH PLUS) 0.5 % SOLN Place 1-2 drops into both eyes 1 day or 1 dose.     cetirizine (ZYRTEC) 10 MG tablet Take by mouth.     diclofenac Sodium (VOLTAREN) 1 %  GEL Apply topically.     EPINEPHrine 0.3 mg/0.3 mL IJ SOAJ injection      escitalopram (LEXAPRO) 5 MG tablet Take 1 tablet by mouth daily.     fluticasone (FLONASE) 50 MCG/ACT nasal spray Place 1 spray into both nostrils daily.     levocetirizine (XYZAL) 5 MG tablet Take by mouth.     levothyroxine (SYNTHROID, LEVOTHROID) 100 MCG tablet Take 100 mcg by mouth daily before breakfast.     loperamide (IMODIUM) 2 MG capsule Take 2 mg by mouth 4 (four) times daily as needed.     montelukast (SINGULAIR) 10 MG tablet      ondansetron (ZOFRAN ODT) 4 MG disintegrating tablet Take 1 tablet (4 mg total) by mouth every 8 (eight) hours as needed for nausea or vomiting. 20 tablet 0   traZODone (DESYREL) 50 MG tablet Take by mouth.     lisinopril (PRINIVIL,ZESTRIL) 2.5 MG tablet Take 20 mg by mouth.     No current facility-administered medications for this visit.    Allergies as of 09/28/2022 - Review Complete 09/28/2022  Allergen Reaction Noted   Azithromycin Other (See Comments) 09/13/2010   Onion Diarrhea 09/27/2020   Lactose Diarrhea 11/08/2019   Nsaids Other (See Comments) 08/10/2017   Erythromycin Nausea And Vomiting 07/12/2015   Other Diarrhea 11/23/2012   Doxycycline Rash 07/12/2015   Latex Rash 09/20/2019    Past Medical History:  Diagnosis Date   Anemia    slightly   Arthritis  Cancer (HCC)    basal cell and melanoma   Diverticulitis    Hypertension     Past Surgical History:  Procedure Laterality Date   APPENDECTOMY     BREAST BIOPSY Left 1980's &06/18/97   neg   EYE SURGERY     KNEE ARTHROSCOPY WITH LATERAL RELEASE Right 11/10/2020   Procedure: Right knee arthroscopy, partial lateral meniscectomy and chondroplasty with subchondroplasty to the lateral femoral condyle and lateral tibial plateau;  Surgeon: Signa Kell, MD;  Location: [facility];  Service: Orthopedics;  Laterality: Right;  Latex   KNEE ARTHROSCOPY WITH MEDIAL MENISECTOMY Right 08/18/2020   Procedure:  Right knee arthroscopic partial lateral and/or medial meniscectomy with partial synovectomy;  Surgeon: Signa Kell, MD;  Location: [facility];  Service: Orthopedics;  Laterality: Right;    Review of Systems:    All systems reviewed and negative except where noted in HPI.   Physical Examination:   BP (!) 161/67   Pulse 67   Temp 98.3 F (36.8 C)   Ht 5\' 3"  (1.6 m)   Wt 117 lb 6.4 oz (53.3 kg)   BMI 20.80 kg/m   General: Well-nourished, well-developed in no acute distress.  Lungs: Clear to auscultation bilaterally. Non-labored. Heart: Regular rate and rhythm, no murmurs rubs or gallops.  Abdomen: Bowel sounds are normal; Abdomen is Soft; No hepatosplenomegaly, masses or hernias;  No Abdominal Tenderness; No guarding or rebound tenderness. Neuro: Alert and oriented x 3.  Grossly intact.  Psych: Alert and cooperative, normal mood and affect.  Imaging Studies: No results found.  Assessment and Plan:   Sara Murphy is a 77 y.o. y/o female returns for follow-up of chronic diarrhea.  Recent GI pathogen panel and C. difficile stool test were negative for infections.  Fecal elastase and fecal calprotectin were normal.  No evidence of pancreatic insufficiency or inflammatory bowel disease.  Differential for her diarrhea includes irritable bowel syndrome versus microscopic colitis.  Persistent Chronic Diarrhea - Evaluate for Microscopic Colitis verses IBS-D Scheduling Colonoscopy I discussed risks of colonoscopy with patient to include risk of bleeding, colon perforation, and risk of sedation.  Patient expressed understanding and agrees to proceed with colonoscopy.   **Check Biopsies for Microscopic Colitis.  She can continue Imodium as needed to help control diarrhea, however I instructed her to hold Imodium 1 week prior to colonoscopy to ensure good prep.  2. History of Diverticulitis - Currently Resolved.  No abdominal pain or tenderness on exam today.  Scheduling  updated colonoscopy.  Patient requested small volume prep.  Celso Amy, PA-C  Follow up 1 month after colonoscopy with TG.

## 2022-11-03 ENCOUNTER — Encounter: Payer: Self-pay | Admitting: Gastroenterology

## 2022-11-07 ENCOUNTER — Telehealth: Payer: Self-pay | Admitting: Physician Assistant

## 2022-11-07 NOTE — Telephone Encounter (Signed)
Patient called in to ask question about fresh fruit.

## 2022-11-07 NOTE — Telephone Encounter (Signed)
Patient called in wanting to know if we can give her the time earlier. I informed her that she will have to call endo fore them to assist her.

## 2022-11-09 ENCOUNTER — Encounter: Payer: Self-pay | Admitting: Gastroenterology

## 2022-11-10 ENCOUNTER — Ambulatory Visit
Admission: RE | Admit: 2022-11-10 | Discharge: 2022-11-10 | Disposition: A | Discharge status: Home / Self Care | Payer: Medicare PPO | Attending: Gastroenterology | Admitting: Gastroenterology

## 2022-11-10 ENCOUNTER — Ambulatory Visit: Payer: Medicare PPO | Admitting: Anesthesiology

## 2022-11-10 ENCOUNTER — Encounter: Payer: Self-pay | Admitting: Gastroenterology

## 2022-11-10 ENCOUNTER — Other Ambulatory Visit: Payer: Self-pay

## 2022-11-10 ENCOUNTER — Encounter
Admission: RE | Disposition: A | Discharge status: Home / Self Care | Payer: Self-pay | Source: Home / Self Care | Attending: Gastroenterology

## 2022-11-10 DIAGNOSIS — Z9049 Acquired absence of other specified parts of digestive tract: Secondary | ICD-10-CM | POA: Insufficient documentation

## 2022-11-10 DIAGNOSIS — R197 Diarrhea, unspecified: Secondary | ICD-10-CM

## 2022-11-10 DIAGNOSIS — K529 Noninfective gastroenteritis and colitis, unspecified: Secondary | ICD-10-CM | POA: Diagnosis present

## 2022-11-10 DIAGNOSIS — Z85828 Personal history of other malignant neoplasm of skin: Secondary | ICD-10-CM | POA: Insufficient documentation

## 2022-11-10 DIAGNOSIS — Z79899 Other long term (current) drug therapy: Secondary | ICD-10-CM | POA: Insufficient documentation

## 2022-11-10 DIAGNOSIS — K5730 Diverticulosis of large intestine without perforation or abscess without bleeding: Secondary | ICD-10-CM | POA: Insufficient documentation

## 2022-11-10 DIAGNOSIS — D122 Benign neoplasm of ascending colon: Secondary | ICD-10-CM | POA: Insufficient documentation

## 2022-11-10 DIAGNOSIS — M1990 Unspecified osteoarthritis, unspecified site: Secondary | ICD-10-CM | POA: Insufficient documentation

## 2022-11-10 DIAGNOSIS — K635 Polyp of colon: Secondary | ICD-10-CM | POA: Insufficient documentation

## 2022-11-10 DIAGNOSIS — F419 Anxiety disorder, unspecified: Secondary | ICD-10-CM | POA: Diagnosis not present

## 2022-11-10 DIAGNOSIS — D126 Benign neoplasm of colon, unspecified: Secondary | ICD-10-CM

## 2022-11-10 DIAGNOSIS — I10 Essential (primary) hypertension: Secondary | ICD-10-CM | POA: Insufficient documentation

## 2022-11-10 DIAGNOSIS — Z85820 Personal history of malignant melanoma of skin: Secondary | ICD-10-CM | POA: Diagnosis not present

## 2022-11-10 HISTORY — DX: Hypothyroidism, unspecified: E03.9

## 2022-11-10 HISTORY — PX: COLONOSCOPY WITH PROPOFOL: SHX5780

## 2022-11-10 SURGERY — COLONOSCOPY WITH PROPOFOL
Anesthesia: General

## 2022-11-10 MED ORDER — PROPOFOL 500 MG/50ML IV EMUL
INTRAVENOUS | Status: DC | PRN
  Administered 2022-11-10: 150 ug/kg/min via INTRAVENOUS

## 2022-11-10 MED ORDER — PROPOFOL 1000 MG/100ML IV EMUL
INTRAVENOUS | Status: AC
  Filled 2022-11-10: qty 100

## 2022-11-10 MED ORDER — SODIUM CHLORIDE 0.9 % IV SOLN: INTRAVENOUS | Status: DC

## 2022-11-10 MED ORDER — PROPOFOL 10 MG/ML IV BOLUS
INTRAVENOUS | Status: DC | PRN
  Administered 2022-11-10: 70 mg via INTRAVENOUS
  Administered 2022-11-10: 10 mg via INTRAVENOUS

## 2022-11-10 MED ORDER — LIDOCAINE HCL (CARDIAC) PF 100 MG/5ML IV SOSY
PREFILLED_SYRINGE | INTRAVENOUS | Status: DC | PRN
  Administered 2022-11-10: 40 mg via INTRAVENOUS

## 2022-11-10 NOTE — Anesthesia Procedure Notes (Signed)
Date/Time: 11/10/2022 8:43 AM  Performed by: Stormy Fabian, CRNAPre-anesthesia Checklist: Patient identified, Emergency Drugs available, Suction available and Patient being monitored Patient Re-evaluated:Patient Re-evaluated prior to induction Oxygen Delivery Method: Nasal cannula Induction Type: IV induction Dental Injury: Teeth and Oropharynx as per pre-operative assessment  Comments: Nasal cannula with etCO2 monitoring

## 2022-11-10 NOTE — Anesthesia Postprocedure Evaluation (Signed)
Anesthesia Post Note  Patient: Sara Murphy  Procedure(s) Performed: COLONOSCOPY WITH PROPOFOL  Patient location during evaluation: PACU Anesthesia Type: General Level of consciousness: awake Pain management: pain level controlled Respiratory status: spontaneous breathing Cardiovascular status: stable Anesthetic complications: no   No notable events documented.   Last Vitals:  Vitals:   11/10/22 0822 11/10/22 0859  BP: 134/82 (!) 100/59  Pulse: 80 65  Resp: 18 (!) 22  Temp: (!) 36.2 C (!) 35.7 C  SpO2: 97% 98%    Last Pain:  Vitals:   11/10/22 0859  TempSrc: Temporal  PainSc: Asleep                 VAN STAVEREN,Morning Halberg

## 2022-11-10 NOTE — Op Note (Signed)
[facility] Gastroenterology Patient Name: Sara Murphy Procedure Date: 11/10/2022 8:32 AM MRN: [id number] Account #: [id number] Date of Birth: July 11, 1945 Admit Type: Outpatient Age: 77 [facility] ENDO ROOM 3 Gender: Female Note Status: Finalized Instrument Name: Peds Colonoscope [id number],Peds Colonoscope [id number] Procedure:             Colonoscopy Indications:           Chronic diarrhea Providers:             Wyline Mood MD, MD Referring MD:          Letitia Caul MD, MD (Referring MD) Medicines:             Monitored Anesthesia Care Complications:         No immediate complications. Procedure:             Pre-Anesthesia Assessment:                        - Prior to the procedure, a History and Physical was                         performed, and patient medications, allergies and                         sensitivities were reviewed. The patient's tolerance                         of previous anesthesia was reviewed.                        - The risks and benefits of the procedure and the                         sedation options and risks were discussed with the                         patient. All questions were answered and informed                         consent was obtained.                        - ASA Grade Assessment: II - A patient with mild                         systemic disease.                        After obtaining informed consent, the colonoscope was                         passed under direct vision. Throughout the procedure,                         the patient's blood pressure, pulse, and oxygen                         saturations were monitored continuously. The                         Colonoscope  was introduced through the anus and                         advanced to the the cecum, identified by the                         appendiceal orifice. The Colonoscope was introduced                         through the and advanced to the the cecum,  identified                         by the appendiceal orifice. The colonoscopy was                         somewhat difficult due to a tortuous colon. The                         patient tolerated the procedure well. The quality of                         the bowel preparation was fair. The ileocecal valve,                         appendiceal orifice, and rectum were photographed. Findings:      The perianal and digital rectal examinations were normal.      Multiple small-mouthed diverticula were found in the left colon.      Three sessile polyps were found in the ascending colon. The polyps were       3 to 4 mm in size. These polyps were removed with a jumbo cold forceps.       Resection and retrieval were complete.      Normal mucosa was found in the entire colon. Biopsies were taken with a       cold forceps for histology.      The exam was otherwise without abnormality. Impression:            - Preparation of the colon was fair.                        - Diverticulosis in the left colon.                        - Three 3 to 4 mm polyps in the ascending colon,                         removed with a jumbo cold forceps. Resected and                         retrieved.                        - Normal mucosa in the entire examined colon. Biopsied.                        - The examination was otherwise normal. Recommendation:        - Discharge patient to home (with escort).                        -  Resume previous diet.                        - Continue present medications.                        - Await pathology results.                        - Repeat colonoscopy is not recommended due to current                         age (13 years or older) for surveillance.                        - Return to GI office as previously scheduled. Procedure Code(s):     --- Professional ---                        [phone], Colonoscopy, flexible; with biopsy, single or                          multiple Diagnosis Code(s):     --- Professional ---                        D12.2, Benign neoplasm of ascending colon                        K52.9, Noninfective gastroenteritis and colitis,                         unspecified                        K57.30, Diverticulosis of large intestine without                         perforation or abscess without bleeding CPT copyright 2022 American Medical Association. All rights reserved. The codes documented in this report are preliminary and upon coder review may  be revised to meet current compliance requirements. Wyline Mood, MD Wyline Mood MD, MD 11/10/2022 8:58:14 AM This report has been signed electronically. Number of Addenda: 0 Note Initiated On: 11/10/2022 8:32 AM Scope Withdrawal Time: 0 hours 8 minutes 33 seconds  Total Procedure Duration: 0 hours 14 minutes 24 seconds  Estimated Blood Loss:  Estimated blood loss: none.      [facility]

## 2022-11-10 NOTE — Anesthesia Preprocedure Evaluation (Addendum)
Anesthesia Evaluation  Patient identified by MRN, date of birth, ID band Patient awake    Reviewed: Allergy & Precautions, NPO status , Patient's Chart, lab work & pertinent test results  Airway Mallampati: II  TM Distance: >3 FB Neck ROM: Full    Dental  (+) Teeth Intact   Pulmonary neg pulmonary ROS   Pulmonary exam normal breath sounds clear to auscultation       Cardiovascular Exercise Tolerance: Good hypertension, Pt. on medications negative cardio ROS Normal cardiovascular exam Rhythm:Regular Rate:Normal     Neuro/Psych   Anxiety      Neuromuscular disease negative neurological ROS  negative psych ROS   GI/Hepatic negative GI ROS, Neg liver ROS,,,  Endo/Other  negative endocrine ROS    Renal/GU negative Renal ROS  negative genitourinary   Musculoskeletal  (+) Arthritis ,    Abdominal Normal abdominal exam  (+)   Peds negative pediatric ROS (+)  Hematology negative hematology ROS (+) Blood dyscrasia   Anesthesia Other Findings Past Medical History: No date: Anemia     Comment:  slightly No date: Arthritis No date: Cancer (HCC)     Comment:  basal cell and melanoma No date: Diverticulitis No date: Hypertension  Past Surgical History: No date: APPENDECTOMY 1980's &06/18/97: BREAST BIOPSY; Left     Comment:  neg No date: EYE SURGERY 11/10/2020: KNEE ARTHROSCOPY WITH LATERAL RELEASE; Right     Comment:  Procedure: Right knee arthroscopy, partial lateral               meniscectomy and chondroplasty with subchondroplasty to               the lateral femoral condyle and lateral tibial plateau;                Surgeon: Signa Kell, MD;  Location: [facility];  Service: Orthopedics;  Laterality: Right;  Latex 08/18/2020: KNEE ARTHROSCOPY WITH MEDIAL MENISECTOMY; Right     Comment:  Procedure: Right knee arthroscopic partial lateral               and/or medial meniscectomy with partial  synovectomy;                Surgeon: Signa Kell, MD;  Location: [facility];  Service: Orthopedics;  Laterality: Right;     Reproductive/Obstetrics negative OB ROS                             Anesthesia Physical Anesthesia Plan  ASA: 2  Anesthesia Plan: General   Post-op Pain Management:    Induction: Intravenous  PONV Risk Score and Plan: Propofol infusion and TIVA  Airway Management Planned: Natural Airway and Nasal Cannula  Additional Equipment:   Intra-op Plan:   Post-operative Plan:   Informed Consent: I have reviewed the patients History and Physical, chart, labs and discussed the procedure including the risks, benefits and alternatives for the proposed anesthesia with the patient or authorized representative who has indicated his/her understanding and acceptance.     Dental Advisory Given  Plan Discussed with: CRNA and Surgeon  Anesthesia Plan Comments:        Anesthesia Quick Evaluation

## 2022-11-10 NOTE — Transfer of Care (Signed)
Immediate Anesthesia Transfer of Care Note  Patient: Sara Murphy  Procedure(s) Performed: Procedure(s): COLONOSCOPY WITH PROPOFOL (N/A)  Patient Location: PACU and Endoscopy Unit  Anesthesia Type:General  Level of Consciousness: sedated  Airway & Oxygen Therapy: Patient Spontanous Breathing and Patient connected to nasal cannula oxygen  Post-op Assessment: Report given to RN and Post -op Vital signs reviewed and stable  Post vital signs: Reviewed and stable  Last Vitals:  Vitals:   11/10/22 0822 11/10/22 0859  BP: 134/82 (!) 100/59  Pulse: 80 65  Resp: 18 (!) 22  Temp: (!) 36.2 C (!) 35.7 C  SpO2: 97% 98%    Complications: No apparent anesthesia complications

## 2022-11-10 NOTE — H&P (Signed)
Wyline Mood, MD [address], [place], [place], [address] [address], [place], [place], [address] Phone: [phone]  Fax: [phone]  Primary Care Physician:  Rolm Gala, MD   Pre-Procedure History & Physical: HPI:  Sara Murphy is a 77 y.o. female is here for an colonoscopy.   Past Medical History:  Diagnosis Date   Anemia    slightly   Arthritis    Cancer (HCC)    basal cell and melanoma   Diverticulitis    Hypertension    Hypothyroidism     Past Surgical History:  Procedure Laterality Date   APPENDECTOMY     BREAST BIOPSY Left 1980's &06/18/97   neg   COLONOSCOPY     x2   EYE SURGERY     KNEE ARTHROSCOPY WITH LATERAL RELEASE Right 11/10/2020   Procedure: Right knee arthroscopy, partial lateral meniscectomy and chondroplasty with subchondroplasty to the lateral femoral condyle and lateral tibial plateau;  Surgeon: Signa Kell, MD;  Location: [facility];  Service: Orthopedics;  Laterality: Right;  Latex   KNEE ARTHROSCOPY WITH MEDIAL MENISECTOMY Right 08/18/2020   Procedure: Right knee arthroscopic partial lateral and/or medial meniscectomy with partial synovectomy;  Surgeon: Signa Kell, MD;  Location: [facility];  Service: Orthopedics;  Laterality: Right;    Prior to Admission medications   Medication Sig Start Date End Date Taking? Authorizing Provider  escitalopram (LEXAPRO) 5 MG tablet Take 1 tablet by mouth daily. 01/24/19  Yes [provider]  lisinopril (ZESTRIL) 20 MG tablet Take 20 mg by mouth daily.   Yes [provider]  montelukast (SINGULAIR) 10 MG tablet  09/18/17  Yes [provider]  acetaminophen (TYLENOL) 325 MG tablet Take by mouth. 08/31/22   [provider]  amLODipine (NORVASC) 10 MG tablet Take 5 mg by mouth daily. 03/19/21   [provider]  carboxymethylcellulose (REFRESH PLUS) 0.5 % SOLN Place 1-2 drops into both eyes 1 day or 1 dose.     [provider]  cetirizine (ZYRTEC) 10 MG tablet Take by mouth.    [provider]  diclofenac Sodium (VOLTAREN) 1 % GEL Apply topically. 01/18/22   [provider]  EPINEPHrine 0.3 mg/0.3 mL IJ SOAJ injection  12/02/21   [provider]  fluticasone (FLONASE) 50 MCG/ACT nasal spray Place 1 spray into both nostrils daily.    [provider]  levocetirizine (XYZAL) 5 MG tablet Take by mouth. 01/11/21   [provider]  levothyroxine (SYNTHROID, LEVOTHROID) 100 MCG tablet Take 100 mcg by mouth daily before breakfast.    [provider]  lisinopril (PRINIVIL,ZESTRIL) 2.5 MG tablet Take 20 mg by mouth. 06/28/17 11/10/20  [provider]  loperamide (IMODIUM) 2 MG capsule Take 2 mg by mouth 4 (four) times daily as needed.    [provider]  ondansetron (ZOFRAN ODT) 4 MG disintegrating tablet Take 1 tablet (4 mg total) by mouth every 8 (eight) hours as needed for nausea or vomiting. 11/10/20   Signa Kell, MD  traZODone (DESYREL) 50 MG tablet Take by mouth.    [provider]    Allergies as of 09/28/2022 - Review Complete 09/28/2022  Allergen Reaction Noted   Azithromycin Other (See Comments) 09/13/2010   Onion Diarrhea 09/27/2020   Lactose Diarrhea 11/08/2019   Nsaids Other (See Comments) 08/10/2017   Erythromycin Nausea And Vomiting 07/12/2015   Other Diarrhea 11/23/2012   Doxycycline Rash 07/12/2015   Latex Rash 09/20/2019  Family History  Problem Relation Age of Onset   Breast cancer Cousin 48    Social History   Socioeconomic History   Marital status: Married    Spouse name: Not on file   Number of children: Not on file   Years of education: Not on file   Highest education level: Not on file  Occupational History   Not on file  Tobacco Use   Smoking status: Never   Smokeless tobacco: Never  Vaping Use   Vaping status: Never Used  Substance and Sexual Activity   Alcohol use: Yes     Comment: none last 24hrs   Drug use: Never   Sexual activity: Not on file  Other Topics Concern   Not on file  Social History Narrative   Not on file   Social Determinants of Health   Financial Resource Strain: Low Risk  (01/27/2022)   Received from [facility], [facility]   Overall Financial Resource Strain (CARDIA)    Difficulty of Paying Living Expenses: Not very hard  Food Insecurity: No Food Insecurity (01/27/2022)   Received from [facility], [facility]   Hunger Vital Sign    Worried About Running Out of Food in the Last Year: Never true    Ran Out of Food in the Last Year: Never true  Transportation Needs: No Transportation Needs (01/27/2022)   Received from [facility], [facility]   [facility] - Transportation    In the past 12 months, has lack of transportation kept you from medical appointments or from getting medications?: No    Lack of Transportation (Non-Medical): No  Physical Activity: Not on file  Stress: Not on file  Social Connections: Not on file  Intimate Partner Violence: Not on file    Review of Systems: See HPI, otherwise negative ROS  Physical Exam: BP 134/82   Pulse 80   Temp (!) 97.2 F (36.2 C) (Temporal)   Resp 18   Ht 5\' 3"  (1.6 m)   Wt 50.7 kg   SpO2 97%   BMI 19.80 kg/m  General:   Alert,  pleasant and cooperative in NAD Head:  Normocephalic and atraumatic. Neck:  Supple; no masses or thyromegaly. Lungs:  Clear throughout to auscultation, normal respiratory effort.    Heart:  +S1, +S2, Regular rate and rhythm, No edema. Abdomen:  Soft, nontender and nondistended. Normal bowel sounds, without guarding, and without rebound.   Neurologic:  Alert and  oriented x4;  grossly normal neurologically.  Impression/Plan: Sara Murphy is here for an colonoscopy to be performed for diarrhea Risks, benefits, limitations, and  alternatives regarding  colonoscopy have been reviewed with the patient.  Questions have been answered.  All parties agreeable.   Wyline Mood, MD  11/10/2022, 8:37 AM

## 2022-11-11 ENCOUNTER — Telehealth: Payer: Self-pay | Admitting: Physician Assistant

## 2022-11-11 ENCOUNTER — Encounter: Payer: Self-pay | Admitting: Gastroenterology

## 2022-11-11 LAB — SURGICAL PATHOLOGY

## 2022-11-11 NOTE — Telephone Encounter (Signed)
Dr. Tobi Bastos stated that there is no urgency to be seen and that her appointment to follow up Sara Murphy on 12/08/2022 is a good appointment.

## 2022-11-11 NOTE — Telephone Encounter (Signed)
The patient called in to schedule office visit to see Dr. Tobi Bastos. She is available for morning appointment. 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 Monday and Friday are open days and next week she is available all day Wednesday.

## 2022-11-13 NOTE — Plan of Care (Signed)
[facility] Tonsillectomy/Adenoidectomy, Postoperative PEDS care plan entered in error.

## 2022-11-15 ENCOUNTER — Encounter: Payer: Self-pay | Admitting: Gastroenterology

## 2022-11-15 NOTE — Telephone Encounter (Signed)
I contacted the patient to let her know that Dr. Tobi Bastos stated that it is okay for her to keep the appointment with Inetta Fermo. She said asked why did they he say he wanted to see her and her daughter.

## 2022-11-15 NOTE — Telephone Encounter (Signed)
I was able to speak with the patient after the call was transferred to me. I was able to explain to the patient that after reviewing her procedure report and pathology, there was nothing emergent to discuss. I told her that I believe that Dr. Tobi Murphy wanted her to bring her daughter to her follow up appointment so there would be extra set of ears but to not worry. I told her that if there was something wrong to tell her, that he would of requested a follow up appointment with him specifically and yet he stated that it was okay to follow up with Sara Murphy or PA-C. Patient felt better after speaking to her and stated that she felt at ease after me going over everything for her. Patient had no further questions but I told her to call me if anything else would come up.

## 2022-12-06 ENCOUNTER — Other Ambulatory Visit: Payer: Self-pay

## 2022-12-07 NOTE — Progress Notes (Unsigned)
Sara Amy, PA-C [address]  [address]  [place], [place] [address]  Main: [phone]  Fax: [phone]   Primary Care Physician: Sara Gala, MD  Primary Gastroenterologist:  Sara Amy, PA-C / Dr. Wyline Murphy    CC: Follow-up chronic diarrhea  HPI: Sara Murphy is a 77 y.o. female returns for 32-month follow-up of chronic diarrhea, ongoing for many years.  GI pathogen panel and C. difficile stool test negative.  Fecal elastase and calprotectin were normal.  She continued to have clear for bowel movements first thing in the morning.  First stool is formed and subsequent stools are loose.  She takes Imodium 1 or 2 tablets daily which helps control her diarrhea.  She is requesting prescription for generic Imodium (loperamide).  Colonoscopy 11/10/2022 by Dr. Tobi Bastos showed fair prep, moderate left-sided diverticulosis, 3 small (3 mm to 4 mm) polyps removed from ascending colon.  1 tubular adenoma, 1 hyperplastic, 1 sessile serrated polyp.  Repeat colonoscopy not recommended due to advanced age.  Biopsy negative for microscopic colitis.  She had acute uncomplicated sigmoid diverticulitis 08/2022 treated with IV Zosyn and p.o. Augmentin which resolved.  She has no more abdominal pain at present.  Celiac labs negative in 2021. Colonoscopy 10/2014 at [facility] showed biopsies negative for microscopic colitis.  Current Outpatient Medications  Medication Sig Dispense Refill   acetaminophen (TYLENOL) 325 MG tablet Take by mouth.     amLODipine (NORVASC) 10 MG tablet Take 5 mg by mouth daily.     carboxymethylcellulose (REFRESH PLUS) 0.5 % SOLN Place 1-2 drops into both eyes 1 day or 1 dose.     cetirizine (ZYRTEC) 10 MG tablet Take by mouth.     diclofenac Sodium (VOLTAREN) 1 % GEL Apply topically.     EPINEPHrine 0.3 mg/0.3 mL IJ SOAJ injection      escitalopram (LEXAPRO) 5 MG tablet Take 1 tablet by mouth daily.     fluticasone (FLONASE) 50 MCG/ACT nasal spray Place 1  spray into both nostrils daily.     levocetirizine (XYZAL) 5 MG tablet Take by mouth.     levothyroxine (SYNTHROID, LEVOTHROID) 100 MCG tablet Take 100 mcg by mouth daily before breakfast.     lisinopril (ZESTRIL) 20 MG tablet Take 20 mg by mouth daily.     montelukast (SINGULAIR) 10 MG tablet      ondansetron (ZOFRAN ODT) 4 MG disintegrating tablet Take 1 tablet (4 mg total) by mouth every 8 (eight) hours as needed for nausea or vomiting. 20 tablet 0   polycarbophil (FIBERCON) 625 MG tablet Take 2 tablets (1,250 mg total) by mouth daily.     traMADol (ULTRAM) 50 MG tablet Take 1-2 tablets by mouth at bedtime as needed for knee pain.     traZODone (DESYREL) 50 MG tablet Take by mouth.     lisinopril (PRINIVIL,ZESTRIL) 2.5 MG tablet Take 20 mg by mouth.     loperamide (IMODIUM) 2 MG capsule Take 1 capsule (2 mg total) by mouth 2 (two) times daily. 60 capsule 11   No current facility-administered medications for this visit.    Allergies as of 12/08/2022 - Review Complete 12/08/2022  Allergen Reaction Noted   Azithromycin Other (See Comments) 09/13/2010   Onion Diarrhea 09/27/2020   Lactose Diarrhea 11/08/2019   Nsaids Other (See Comments) 08/10/2017   Erythromycin Nausea And Vomiting 07/12/2015   Other Diarrhea 11/23/2012   Doxycycline Rash 07/12/2015   Latex Rash 09/20/2019    Past Medical History:  Diagnosis Date   Anemia    slightly   Arthritis    Cancer (HCC)    basal cell and melanoma   Diverticulitis    Hypertension    Hypothyroidism     Past Surgical History:  Procedure Laterality Date   APPENDECTOMY     BREAST BIOPSY Left 1980's &06/18/97   neg   COLONOSCOPY     x2   COLONOSCOPY WITH PROPOFOL N/A 11/10/2022   Procedure: COLONOSCOPY WITH PROPOFOL;  Surgeon: Sara Mood, MD;  Location: [facility];  Service: Gastroenterology;  Laterality: N/A;   EYE SURGERY     KNEE ARTHROSCOPY WITH LATERAL RELEASE Right 11/10/2020   Procedure: Right knee arthroscopy, partial  lateral meniscectomy and chondroplasty with subchondroplasty to the lateral femoral condyle and lateral tibial plateau;  Surgeon: Signa Kell, MD;  Location: [facility];  Service: Orthopedics;  Laterality: Right;  Latex   KNEE ARTHROSCOPY WITH MEDIAL MENISECTOMY Right 08/18/2020   Procedure: Right knee arthroscopic partial lateral and/or medial meniscectomy with partial synovectomy;  Surgeon: Signa Kell, MD;  Location: [facility];  Service: Orthopedics;  Laterality: Right;    Review of Systems:    All systems reviewed and negative except where noted in HPI.   Physical Examination:   BP 120/64   Pulse 68   Temp 97.7 F (36.5 C)   Ht 5\' 3"  (1.6 m)   Wt 116 lb 9.6 oz (52.9 kg)   BMI 20.65 kg/m   General: Well-nourished, well-developed in no acute distress.  Neuro: Alert and oriented x 3.  Grossly intact.  Psych: Alert and cooperative, normal Murphy and affect.   Imaging Studies: No results found.  Assessment and Plan:   Sara Murphy is a 77 y.o. y/o female returns for follow-up of chronic diarrhea.  Recent colonoscopy showed no evidence of microscopic colitis or IBD.  No evidence of colon cancer.  Stool studies negative for infectious pathogens.  Normal fecal elastase, no evidence of EPI.  Celiac labs negative.  Chronic diarrhea for many years.  Extensive GI evaluation negative.  Still has gallbladder.  Diarrhea most consistent with irritable bowel syndrome, diarrhea predominant.  1.  Chronic diarrhea, IBS-D  Continue Imodium as needed; Rx Given.  Start OTC FiberCon 2 tablets daily with 6 - 8 ounces of water.  2.  History of Diverticulitis - Resolved.  Patient education Discussed.  Return for f/u if she has any recurrent lower abdominal pain in the future.  Sara Amy, PA-C  Follow up on as needed if she has any recurrent GI symptoms.

## 2022-12-08 ENCOUNTER — Ambulatory Visit: Payer: Medicare PPO | Admitting: Physician Assistant

## 2022-12-08 ENCOUNTER — Encounter: Payer: Self-pay | Admitting: Physician Assistant

## 2022-12-08 VITALS — BP 120/64 | HR 68 | Temp 97.7°F | Ht 63.0 in | Wt 116.6 lb

## 2022-12-08 DIAGNOSIS — K529 Noninfective gastroenteritis and colitis, unspecified: Secondary | ICD-10-CM | POA: Diagnosis not present

## 2022-12-08 DIAGNOSIS — R197 Diarrhea, unspecified: Secondary | ICD-10-CM

## 2022-12-08 MED ORDER — LOPERAMIDE HCL 2 MG PO CAPS: 2.0000 mg | ORAL_CAPSULE | Freq: Two times a day (BID) | ORAL | 11 refills | Status: AC

## 2022-12-08 MED ORDER — FIBERCON 625 MG PO TABS
1250.0000 mg | ORAL_TABLET | Freq: Every day | ORAL | Status: AC
Start: 2022-12-08 — End: 2023-06-06

## 2023-03-21 ENCOUNTER — Encounter: Payer: Self-pay | Admitting: Family Medicine

## 2023-03-22 ENCOUNTER — Other Ambulatory Visit: Payer: Self-pay | Admitting: Family Medicine

## 2023-03-22 DIAGNOSIS — R921 Mammographic calcification found on diagnostic imaging of breast: Secondary | ICD-10-CM

## 2023-05-19 ENCOUNTER — Ambulatory Visit
Admission: RE | Admit: 2023-05-19 | Discharge: 2023-05-19 | Disposition: A | Discharge status: Home / Self Care | Source: Ambulatory Visit | Attending: Family Medicine | Admitting: Family Medicine

## 2023-05-19 DIAGNOSIS — R921 Mammographic calcification found on diagnostic imaging of breast: Secondary | ICD-10-CM | POA: Diagnosis present

## 2024-02-13 IMAGING — MG MM DIGITAL SCREENING BILAT W/ TOMO AND CAD
8 series · 9 of 24 positions shown · non-contrast
Comparison: Previous exam(s).

CLINICAL DATA: Screening.

EXAM:
DIGITAL SCREENING BILATERAL MAMMOGRAM WITH TOMOSYNTHESIS AND CAD
TECHNIQUE: Bilateral screening digital craniocaudal and mediolateral oblique
mammograms were obtained. Bilateral screening digital breast
tomosynthesis was performed. The images were evaluated with
computer-aided detection.

[R CC synth-2D]
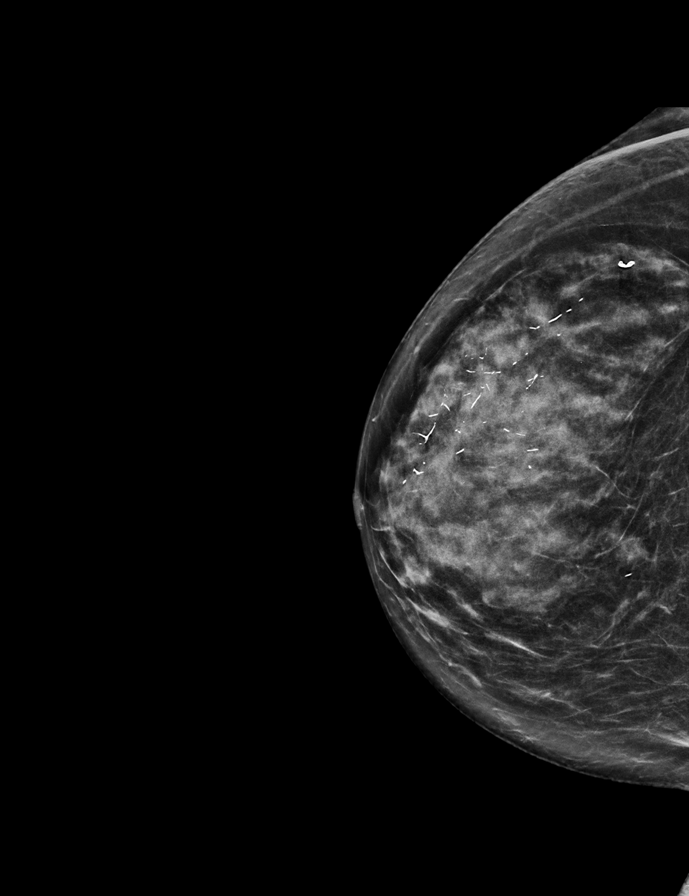

[L CC synth-2D]
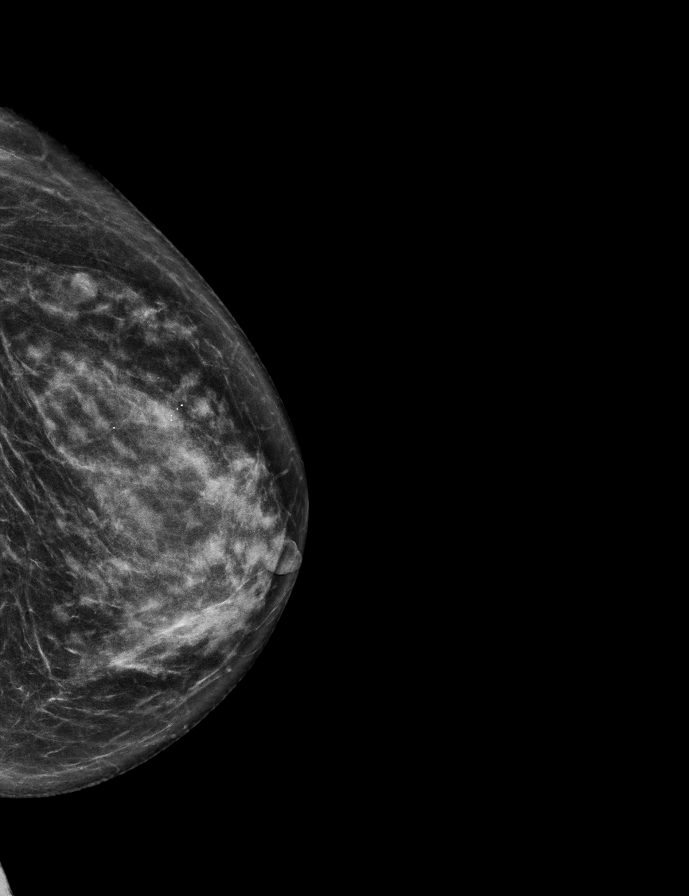

[L MLO synth-2D]
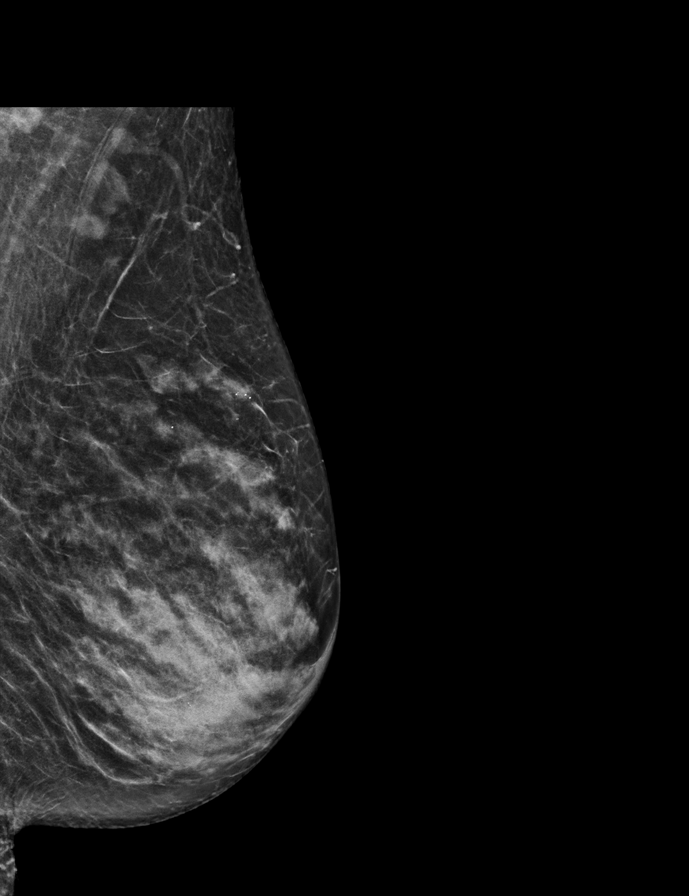

[R MLO synth-2D]
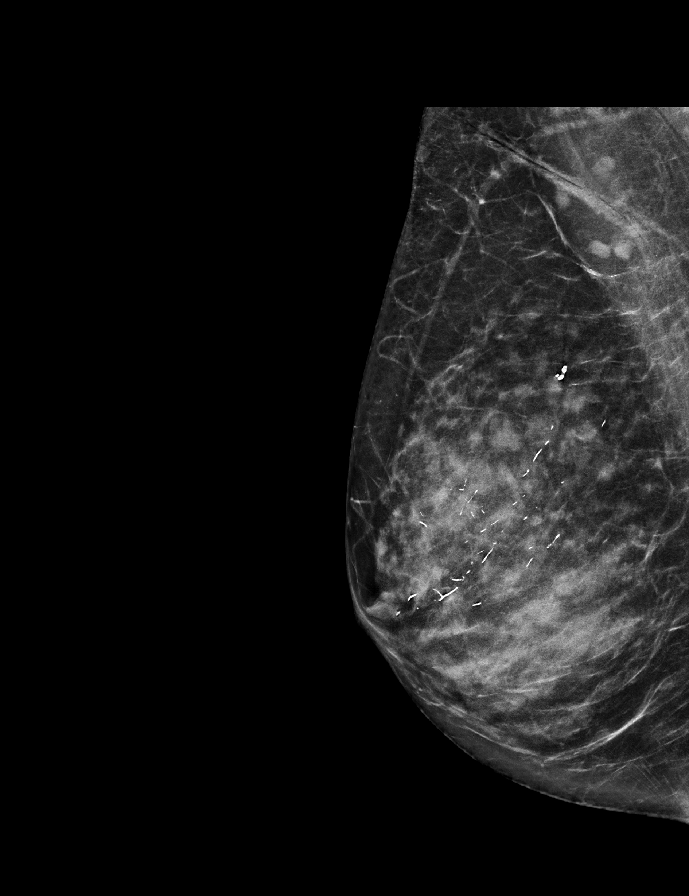

[L MLO tomo · 2 of 56 frames shown]
[frame 19/56]
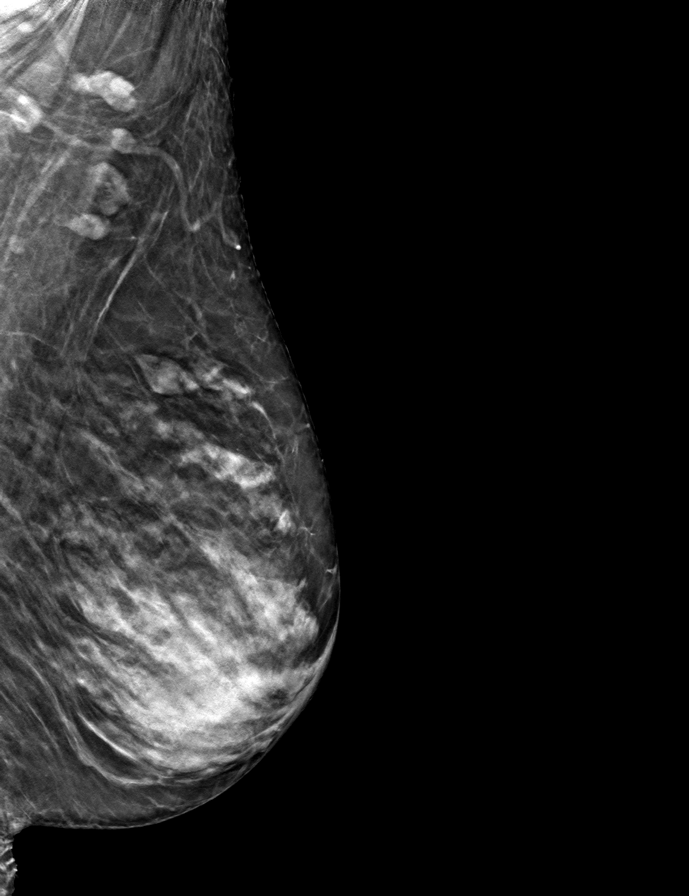
[frame 29/56]
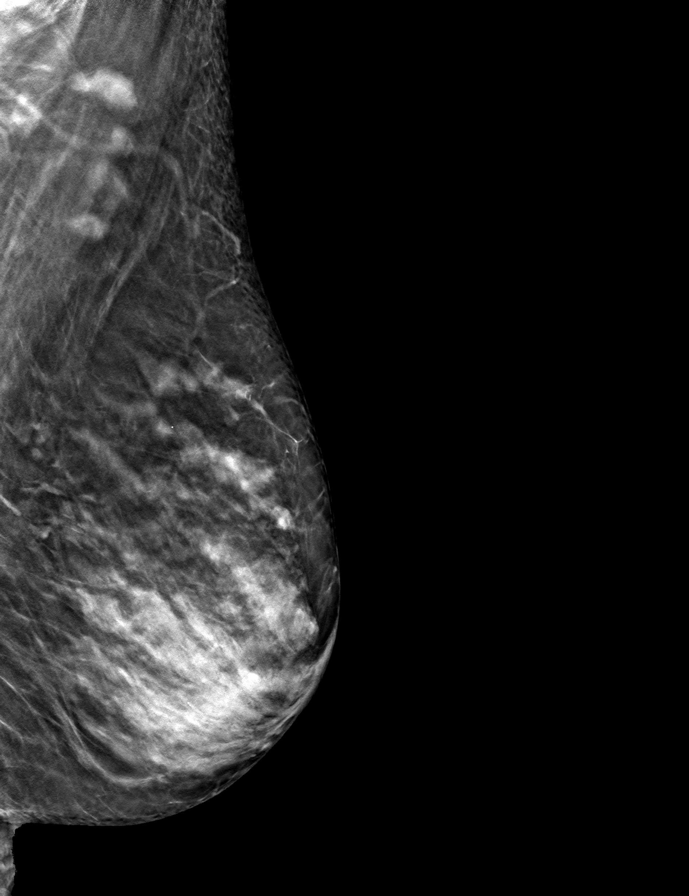

[R CC tomo · tomo slice 29/57.0]
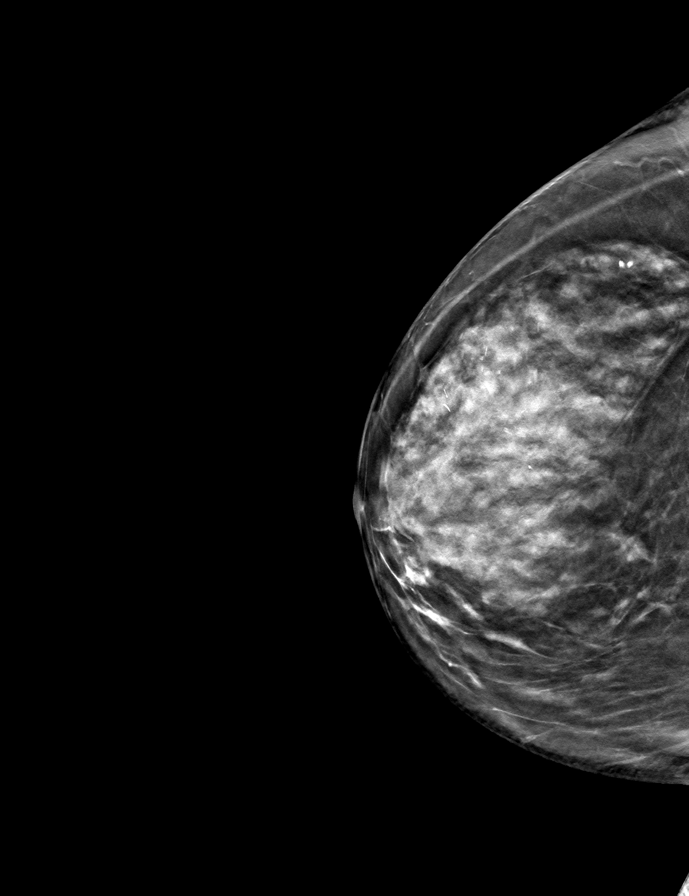

[R MLO tomo · tomo slice 31/60.0]
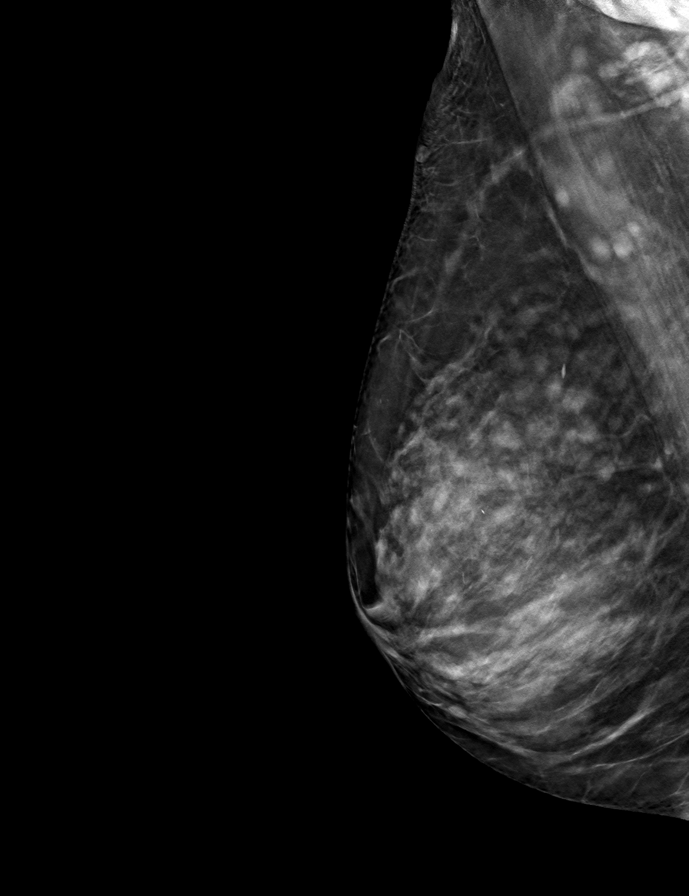

[L CC tomo · tomo slice 29/58.0]
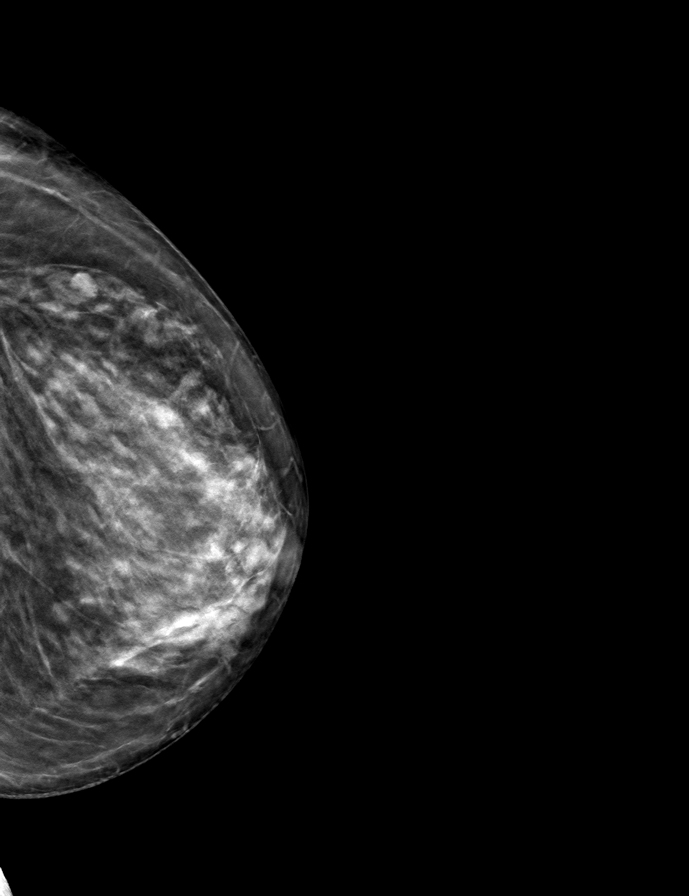

[9 of 24 positions shown; findings below may reference images not displayed]

ACR Breast Density Category c: The breast tissue is heterogeneously
dense, which may obscure small masses.
FINDINGS: In the left breast, calcifications and a possible mass warrants
further evaluation. The possible mass is seen within the outer LEFT
breast, cc view only, slice 30. The calcifications are seen within
the upper-outer quadrant of the LEFT breast.

In the right breast, no findings suspicious for malignancy.
IMPRESSION: Further evaluation is suggested for calcifications and a possible
mass in the left breast.

RECOMMENDATION:
Diagnostic mammogram and possibly ultrasound of the left breast.
(Code:[id number]-[id number])

The patient will be contacted regarding the findings, and additional
imaging will be scheduled.

BI-RADS CATEGORY  0: Incomplete. Need additional imaging evaluation
and/or prior mammograms for comparison.
